# Patient Record
Sex: Female | Born: 1935 | Race: Black or African American | Hispanic: No | Smoking: Never smoker
Health system: Southern US, Community
[De-identification: ages and names within clinical notes are randomized; demographics above are authoritative.]

## PROBLEM LIST (undated history)

## (undated) DIAGNOSIS — I10 Essential (primary) hypertension: Secondary | ICD-10-CM

## (undated) DIAGNOSIS — E785 Hyperlipidemia, unspecified: Secondary | ICD-10-CM

## (undated) DIAGNOSIS — I509 Heart failure, unspecified: Secondary | ICD-10-CM

## (undated) DIAGNOSIS — I219 Acute myocardial infarction, unspecified: Secondary | ICD-10-CM

## (undated) DIAGNOSIS — I350 Nonrheumatic aortic (valve) stenosis: Secondary | ICD-10-CM

## (undated) DIAGNOSIS — K219 Gastro-esophageal reflux disease without esophagitis: Secondary | ICD-10-CM

## (undated) DIAGNOSIS — M1990 Unspecified osteoarthritis, unspecified site: Secondary | ICD-10-CM

## (undated) DIAGNOSIS — R600 Localized edema: Secondary | ICD-10-CM

## (undated) DIAGNOSIS — I499 Cardiac arrhythmia, unspecified: Secondary | ICD-10-CM

## (undated) DIAGNOSIS — H409 Unspecified glaucoma: Secondary | ICD-10-CM

## (undated) DIAGNOSIS — Z9289 Personal history of other medical treatment: Secondary | ICD-10-CM

## (undated) DIAGNOSIS — I2510 Atherosclerotic heart disease of native coronary artery without angina pectoris: Secondary | ICD-10-CM

## (undated) HISTORY — DX: Personal history of other medical treatment: Z92.89

## (undated) HISTORY — DX: Localized edema: R60.0

## (undated) HISTORY — DX: Atherosclerotic heart disease of native coronary artery without angina pectoris: I25.10

## (undated) HISTORY — PX: GLAUCOMA SURGERY: SHX656

## (undated) HISTORY — DX: Nonrheumatic aortic (valve) stenosis: I35.0

---

## 1995-06-05 DIAGNOSIS — H409 Unspecified glaucoma: Secondary | ICD-10-CM

## 1995-06-05 DIAGNOSIS — M1990 Unspecified osteoarthritis, unspecified site: Secondary | ICD-10-CM

## 1995-06-05 HISTORY — DX: Unspecified glaucoma: H40.9

## 1995-06-05 HISTORY — DX: Unspecified osteoarthritis, unspecified site: M19.90

## 1997-09-08 ENCOUNTER — Emergency Department (HOSPITAL_COMMUNITY): Admission: EM | Admit: 1997-09-08 | Discharge: 1997-09-08 | Payer: Self-pay

## 1998-05-22 HISTORY — PX: CARDIAC CATHETERIZATION: SHX172

## 1998-12-29 ENCOUNTER — Emergency Department (HOSPITAL_COMMUNITY): Admission: EM | Admit: 1998-12-29 | Discharge: 1998-12-29 | Payer: Self-pay | Admitting: Emergency Medicine

## 2000-11-25 ENCOUNTER — Encounter: Payer: Self-pay | Admitting: Cardiovascular Disease

## 2000-11-25 ENCOUNTER — Ambulatory Visit (HOSPITAL_COMMUNITY): Admission: RE | Admit: 2000-11-25 | Discharge: 2000-11-25 | Payer: Self-pay | Admitting: Cardiovascular Disease

## 2001-05-22 ENCOUNTER — Encounter: Payer: Self-pay | Admitting: Internal Medicine

## 2001-05-22 ENCOUNTER — Ambulatory Visit (HOSPITAL_COMMUNITY): Admission: RE | Admit: 2001-05-22 | Discharge: 2001-05-22 | Payer: Self-pay | Admitting: Internal Medicine

## 2001-08-20 ENCOUNTER — Encounter: Payer: Self-pay | Admitting: Emergency Medicine

## 2001-08-20 ENCOUNTER — Inpatient Hospital Stay (HOSPITAL_COMMUNITY): Admission: EM | Admit: 2001-08-20 | Discharge: 2001-08-21 | Payer: Self-pay | Admitting: Emergency Medicine

## 2002-12-01 ENCOUNTER — Encounter: Payer: Self-pay | Admitting: Emergency Medicine

## 2002-12-01 ENCOUNTER — Emergency Department (HOSPITAL_COMMUNITY): Admission: EM | Admit: 2002-12-01 | Discharge: 2002-12-01 | Payer: Self-pay | Admitting: Emergency Medicine

## 2003-01-18 ENCOUNTER — Encounter: Admission: RE | Admit: 2003-01-18 | Discharge: 2003-03-22 | Payer: Self-pay | Admitting: Cardiology

## 2005-03-12 ENCOUNTER — Inpatient Hospital Stay (HOSPITAL_COMMUNITY): Admission: EM | Admit: 2005-03-12 | Discharge: 2005-03-14 | Payer: Self-pay | Admitting: *Deleted

## 2005-03-14 ENCOUNTER — Encounter (INDEPENDENT_AMBULATORY_CARE_PROVIDER_SITE_OTHER): Payer: Self-pay | Admitting: Specialist

## 2006-06-04 HISTORY — PX: CATARACT EXTRACTION: SUR2

## 2007-01-06 ENCOUNTER — Inpatient Hospital Stay (HOSPITAL_COMMUNITY): Admission: EM | Admit: 2007-01-06 | Discharge: 2007-01-07 | Payer: Self-pay | Admitting: Emergency Medicine

## 2007-01-17 ENCOUNTER — Ambulatory Visit (HOSPITAL_COMMUNITY): Admission: RE | Admit: 2007-01-17 | Discharge: 2007-01-17 | Payer: Self-pay | Admitting: Ophthalmology

## 2007-04-16 ENCOUNTER — Ambulatory Visit (HOSPITAL_COMMUNITY): Admission: RE | Admit: 2007-04-16 | Discharge: 2007-04-16 | Payer: Self-pay | Admitting: Ophthalmology

## 2008-12-23 DIAGNOSIS — Z9289 Personal history of other medical treatment: Secondary | ICD-10-CM

## 2008-12-23 HISTORY — DX: Personal history of other medical treatment: Z92.89

## 2009-03-22 DIAGNOSIS — Z9289 Personal history of other medical treatment: Secondary | ICD-10-CM

## 2009-03-22 HISTORY — DX: Personal history of other medical treatment: Z92.89

## 2009-07-05 ENCOUNTER — Encounter (INDEPENDENT_AMBULATORY_CARE_PROVIDER_SITE_OTHER): Payer: Self-pay | Admitting: Cardiology

## 2009-07-05 ENCOUNTER — Ambulatory Visit: Payer: Self-pay | Admitting: Vascular Surgery

## 2009-07-05 ENCOUNTER — Ambulatory Visit (HOSPITAL_COMMUNITY): Admission: RE | Admit: 2009-07-05 | Discharge: 2009-07-05 | Payer: Self-pay | Admitting: Cardiology

## 2009-10-15 ENCOUNTER — Emergency Department (HOSPITAL_COMMUNITY): Admission: EM | Admit: 2009-10-15 | Discharge: 2009-10-15 | Payer: Self-pay | Admitting: Family Medicine

## 2010-05-22 ENCOUNTER — Encounter
Admission: RE | Admit: 2010-05-22 | Discharge: 2010-05-22 | Payer: Self-pay | Source: Home / Self Care | Attending: Cardiology | Admitting: Cardiology

## 2010-09-12 DIAGNOSIS — Z9289 Personal history of other medical treatment: Secondary | ICD-10-CM

## 2010-09-12 HISTORY — DX: Personal history of other medical treatment: Z92.89

## 2010-10-17 NOTE — Op Note (Signed)
Brittany [name], KREFT NO.:  [id number]   MEDICAL RECORD NO.:  [id number]          PATIENT TYPE:  INP   LOCATION:  3731                         FACILITY:  [facility]   PHYSICIAN:  Salley Scarlet., M.D.DATE OF BIRTH:  May 10, 1911   DATE OF PROCEDURE:  DATE OF DISCHARGE:  01/07/2007                               OPERATIVE REPORT   PREOPERATIVE DIAGNOSIS:  Immature cataract, right eye.   POSTOPERATIVE DIAGNOSIS:  Immature cataract, right eye.   OPERATION:  Extracapsular cataract extraction with intraocular lens  implantation.   ANESTHESIA:  General.   JUSTIFICATION FOR PROCEDURE:  This is a 89-year essentially one-eyed  individual who has been treated for years for glaucoma.  She has  complained of difficulty seeing to read and to get around.  We had  advised her in the past that cataract surgery is to be considered, and  she has decided that she is ready.  She finally requested that the  cataract be removed, and she is admitted at this time for that purpose.  Because she is a one-eyed individual, we decided to do her under general  anesthesia rather than doing her with a retrobulbar.   PROCEDURE:  Under the influence of general inhalation anesthesia, the  patient was prepped and draped in the usual manner.  The lid speculum  was inserted under the upper and lower lid of the right eye and a 4-0  silk traction suture was passed through the belly of the superior rectus  muscle retraction.  A fornix-based conjunctival flap was turned and  hemostasis achieved using cautery.  An groove incision was made around  the limbus at 120 degrees.  The anterior chamber was entered through  this groove incision at 11:30 o'clock position using a Superblade.  Ocucoat was injected into the eye.  An anterior capsulotomy was done.  The pupil was extremely miotic, making even a capsulotomy difficult to  do.  The corneoscleral wound was then extended first toward the left,  then  toward the right, placing a single 8-0 Vicryl suture across each  incision as it was made respectively toward the left and toward the  right.  The nucleus was then manually expressed from the eye with a  moderate amount of difficulty.  The two previously placed sutures were  closed, and a third one was placed at the 12:00 o'clock position.  The  IA handpiece was fastened to the eye, and the residual cortical material  was aspirated.  The posterior capsule was polished using olive-tip  polisher.  An Easy 60 PMMA lens was seated into the eye behind the iris  without difficulty.  The anterior chamber was reformed, and the pupil  was constricted using Miochol.  The corneoscleral wound was closed with  using a combination of interrupted sutures of 8-0 Vicryl and 10-0 nylon.  After it was ascertained that was airtight and watertight, the  conjunctiva was closed using 8-0 Vicryl suture.  One mL of Celestone and  a 0.5 mL of gentamicin were injected subconjunctivally.  Maxitrol  ophthalmic ointment and Pilopine ointment were applied along with a  patch and Fox shield.  The patient tolerated the procedure well and was  discharged to the post-anesthesia recovery room in satisfactory  condition.  She and the family are instructed for to rest today, to keep  the  eye patched, to take Darvocet every 4 hours as needed for pain, and she  is to see me in office tomorrow for further evaluation.   DISCHARGE DIAGNOSIS:  Immature cataract, right eye.      Salley Scarlet., M.D.  Electronically Signed     TB/MEDQ  D:  01/17/2007  T:  01/19/2007  Job:  [id number]

## 2010-10-20 NOTE — Cardiovascular Report (Signed)
[place]. [facility]  Patient:    Brittany [name], Brittany [name]                   MRN: [id number] Proc. Date: 11/25/00 Attending:  Lennette Bihari, M.D. CC:         Dondra Spry. Spruill, M.D.   Cardiac Catheterization  INDICATIONS:  The patient is a very pleasant 77 year old, black female, with a history of VF arrest in the setting of an MI for which she treated with primary PTCA of a bifurcating LAD and diagonal vessel 99% stenosis in December of 1998.  A followup stress test in July 1999 showed a small area of transmural scar with an EF of 52% without significant ischemia.  Additional problems include hypertension, hyperlipidemia.  The patient recently has noticed some mild exertional shortness of breath.  A Persantine Cardiolite study suggested the possibility of ischemia in the LAD territory.  She is now referred for definitive repeat catheterization.  DESCRIPTION OF PROCEDURE:  After premedication with Valium 3 mg intravenously, the patient was prepped and draped in the usual fashion.  The right femoral artery was punctured anteriorly and a 6 French sheath was inserted without difficulty.  Selective angiography was done utilizing a 6 [place] diagnostic left and right coronary catheters.  Of note, after the fifth left coronary injection, the patient seemed to develop VF.  This was immediately recognized. She was defibrillated x1 with restoration of sinus rhythm.  The patient was then treated with IV Lopressor 2.5 mg IV.  Two additional injections were made in the left coronary system without problem.  The right coronary artery was then injected without difficulty.  Biplane left ventriculography was done as was distal aortography utilizing the pigtail catheter.  The arterial sheath was removed with manual compressor.  The patient tolerated the procedure well and will be transported to telemetry for at least 6-8 hours to make sure her rhythm remains  stable.  HEMODYNAMIC DATA:  Central aortic pressure 120/51.  Left ventricular pressure 120/15.  ANGIOGRAPHIC DATA:  Left main coronary artery:  The left main coronary artery had minimal 10% ostial narrowing bifurcating into an LAD and left circumflex system.  Left anterior descending:  The LAD had smooth 60% narrowing before and after the first diagonal vessel with 60% stenosis in this diagonal.  There was 50% mid LAD stenosis.  Circumflex artery:  The circumflex vessel had 20% proximal mid narrowing as well as 30% distal narrowing and all sites were smooth.  Right coronary artery:  The right coronary artery had a shepherds crook takeoff.  There is a 30% proximal narrowing and 20% mid narrowing.  Biplane cinearteriography revealed preserved global LV function.  Ejection fraction was 55%.  There was mild residual mid anterolateral hypocontractility from the patients prior myocardial infarction as seen on the RAO projection. On the LAO projection, contraction appeared normal.  DISTAL AORTOGRAM:  Distal aortography showed irregularity of the infrarenal aorta.  There was 40% right renal artery stenosis.  IMPRESSION: 1. Normal global left ventricular function with residual mild mid    anterolateral hypocontractility. 2. Residual coronary obstructive disease with 50-60% smooth narrowing    in the left anterior descending with 60% diagonal stenosis and 50%    mid left anterior descending stenosis, 20% proximal to 30% mid    left circumflex stenosis, and 20-30% proximal to mid right coronary artery    stenoses. 3. Transient ventricular fibrillation treated with defibrillation. 4. Irregularity of the infrarenal aorta with  40% right renal artery    stenosis.  RECOMMENDATIONS:  The patient is 77 years old.  Her coronary anatomy does show residual narrowing of 50-60% in the LAD in the region of the diagonal takeoff with 60% diagonal stenosis.  Increased medical therapy be recommended and  in specific the patients beta blocker dose will be increased.  She will be transferred to telemetry for monitoring of her heart rhythm following catheterization with ultimate plans for discharge on increased medical therapy. DD:  11/25/00 TD:  11/25/00 Job: [id number]

## 2010-10-20 NOTE — Op Note (Signed)
Brittany [name], CATALFAMO NO.:  [id number]   MEDICAL RECORD NO.:  [id number]          PATIENT TYPE:  INP   LOCATION:  2016                         FACILITY:  [facility]   PHYSICIAN:  Petra Kuba, M.D.    DATE OF BIRTH:  1911-04-25   DATE OF PROCEDURE:  03/14/2005  DATE OF DISCHARGE:                                 OPERATIVE REPORT   PROCEDURE:  EGD with biopsy.   INDICATIONS:  Patient with chronic anemia, some nausea and vomiting on a  moderate amount of aspirin and nonsteroidals.   CONSENT:  Consent was signed after the risks, benefits, methods, and options  were thoroughly discussed yesterday in the hospital.   MEDICINES USED:  Demerol 25, Versed 2.5.   PROCEDURE IN DETAIL:  The video endoscope was inserted by direct vision. The  proximal esophagus was normal. In the mid to distal esophagus was a widely  patent ring with some linear inflammation. There was a small to medium-sized  hiatal hernia. The scope passed easily into the stomach and advanced to the  antrum where some minimal antritis was seen and a few tiny erosions. The  scope was passed into the pylorus and to the duodenal bulb where some mild  bulbitis was seen and a small duodenal bulb ulcer. The scope was passed into  the second portion of the duodenum, which was normal. The scope was  withdrawn back to the bulb which was reevaluated, and no additional findings  were seen. The ulcer did not have any worrisome stigmata. The scope was  withdrawn back to the stomach and retroflexed. Angularis and fundus were  normal. Lesser and greater curve were normal. High in the cardia, the hiatal  hernia was confirmed. Straight visualization of the stomach confirmed some  minimal gastritis. No other abnormalities. A few biopsies of the antrum and  a few of the proximal stomach were obtained to rule out Helicobacter. Air  was suctioned. The scope was slowly withdrawn again. A good look at the  esophagus confirmed the  above findings. The scope was removed. The patient  tolerated the procedure well. There was no obvious immediate complication.   ENDOSCOPIC DIAGNOSIS:  1.  Small to medium-sized hiatal hernia with widely patent ring and linear      information.  2.  Minimal gastritis and a few antral erosions.  3.  Minimal bulbitis and a tiny ulcer in the bulb.  4.  Otherwise, within normal limits EGD without signs of active bleeding.   PLAN:  Pump inhibitors long-term, not only because of her aspirin and  nonsteroidals but also for her hiatal hernia and above-mentioned findings.  Follow-up with Dr. Matthias Hughs as an outpatient in 1-2 months to see if a one-  time  colonoscopy is indicated.  May want to repeat CBCs and guaiacs to help him  decide. Happy to see back sooner p.r.n. Will go ahead and write for her  Protonix once a day. Could in the future, if it is cheaper, use Prilosec  over-the-counter once a day. Will call her with the biopsy results in 1  week.  ______________________________  Petra Kuba, M.D.     MEM/MEDQ  D:  03/14/2005  T:  03/14/2005  Job:  [id number]   cc:   Nicki Guadalajara, M.D.  Fax: [phone]   Bernette Redbird, M.D.  Fax: [phone]   Osvaldo Shipper. Spruill, M.D.  Fax: [phone]

## 2010-10-20 NOTE — Discharge Summary (Signed)
[place]. [facility]  Patient:    Brittany [name], Brittany [name] Visit Number: [id number] MRN: [id number]          Service Type: MED Location: [phone] Attending Physician:  Darlin Priestly Dictated by:   Marya Fossa, P.A. Admit Date:  08/20/2001 Discharge Date: 08/21/2001                             Discharge Summary  DATE OF BIRTH:  June 06, 1910  [facility] Medical Record Number [id number]  ADMISSION DIAGNOSES: 1. Chest pain, rule out myocardial infarction. 2. Accelerated blood pressure. 3. Coronary artery disease. 4. Hyperlipidemia. 5. Glaucoma. 6. Arthritis. 7. History of chronic lower extremity edema.  DISCHARGE DIAGNOSES: 1. Chest pain resolved, myocardial infarction ruled out with negative    enzymes, medical therapy. (Cardiac catheterization not performed as    enzymes and electrocardiogram were negative.  Also, the patient has has    ventricular fibrillation arrest with engagement of left coronary system    x 2 in the past.) 2. Accelerated blood pressure. 3. Coronary artery disease. 4. Hyperlipidemia. 5. Glaucoma. 6. Arthritis. 7. History of chronic lower extremity edema.  HISTORY OF PRESENT ILLNESS:  Brittany [name] is a very pleasant 77 year old African-American female patient of Dr. Nicki Guadalajara with known coronary artery disease, hypertension, hyperlipidemia, arthritis, glaucoma, and lower extremity edema.  She presented to the emergency room on the day of admission with complaints of chest pain.  Apparently when she was going to sleep, she developed left-sided chest pressure and pain without any associated symptoms. She took a nitroglycerin and aspirin with relief of her symptoms.  She then awoke again at 3 in the morning with similar discomfort but no associated symptoms.  She called EMS and was delivered to the emergency room.  She was given aspirin and nitroglycerin again with some relief, but the pain  returned. She states the pain lasted for only a few seconds.  EKG in the emergency room shows normal sinus rhythm with nonspecific ST-T wave changes.  Chest x-ray was unremarkable.  Initial cardiac enzymes negative.  The patient will be admitted for IV heparin and IV nitroglycerin.  Check serial cardiac enzymes.  The plan is that, if she rules out for MI, will consider discharge to home with possible outpatient Cardiolite.  I was to repeat catheterization due to stable coronary artery disease at last catheterization and history of ventricular fibrillation arrest with catheterization.  PROCEDURES:  None.  COMPLICATIONS:  None.  CONSULTATIONS:  None.  COURSE IN HOSPITAL:  Brittany [name] was admitted to [facility] on August 20, 2001, as described above.  IV heparin and IV nitroglycerin; home medicines were continued.  Admission labs showed a WBC 4.9, hemoglobin 11.6, platelets 206.  Sodium 142, potassium 3.7, BUN 14, creatinine 0.8.  Cardiac enzymes were negative x 3.  EKG remained nonacute.  The patient did well overnight and had no further chest pain.  IV heparin and nitroglycerin were discontinued, and she ambulated.  She tolerated this well. She was felt ready for discharge to home by Dr. Elsie Lincoln.  Will plan an outpatient Cardiolite.  Will also give her a prescription to heme check stools as she does have some mild anemia.  If these are positive, she may need a GI workup.  DISCHARGE MEDICATIONS:  1. Isosorbide 60 mg 1-1/2 pills a day.  2. Enteric-coated aspirin 81 mg q.d.  3. Lipitor 80  mg a day.  4. Atenolol 50 mg a day.  5. Lotrel 5/10 mg a day.  6. Lasix 40 mg a day in the morning and 20 mg in the evening.  7. Potassium 20 mEq a day.  8. Alphagan and pilocarpine eye drops as directed.  9. ______ 150 b.i.d. 10. Nitroglycerin as needed for chest pain.  ACTIVITY:  As tolerated.  DIET:  Low fat, low cholesterol, low salt.  SPECIAL INSTRUCTIONS: May call if  any problems or questions.  FOLLOWUP:  Dr. Tresa Endo before 12 oclock.  The office will call with an appointment for stress testing. Dictated by:   Marya Fossa, P.A. Attending Physician:  Darlin Priestly DD:  08/21/01 TD:  08/22/01 Job: [id number]

## 2010-10-20 NOTE — Consult Note (Signed)
NAMEABENA, ERDMAN NO.:  [id number]   MEDICAL RECORD NO.:  [id number]          PATIENT TYPE:  INP   LOCATION:  2016                         FACILITY:  [facility]   PHYSICIAN:  Petra Kuba, M.D.    DATE OF BIRTH:  November 19, 1910   DATE OF CONSULTATION:  03/13/2005  DATE OF DISCHARGE:                                   CONSULTATION   HISTORY OF PRESENT ILLNESS:  The patient seen at the request of Dr. Tresa Endo  for nausea, vomiting, and anemia.  Supposedly, she has a long history of  chronic anemia.  Did have some nausea and vomiting which has since resolved.  Is eating regular food.  Not had a bowel movement today but has not seen any  blood in her bowels.  She had an endoscopy at one point in the past in 1999  per our office chart and may have had a colonoscopy even before that but  does not remember her last evaluation.  She is not having any other GI  symptoms.  Does take both aspirin, nonsteroidal's and Cox inhibitors.   PAST MEDICAL HISTORY:  Pertinent for some heart problems.  She also has  increased cholesterol.  Chronic leg edema.  Glaucoma.  Arthritis.   FAMILY HISTORY:  Negative for any obvious GI problems.   SOCIAL HISTORY:  Quit tobacco in the 82s.  Does not drink.  Does use some  over-the-counter arthritis medicines like ibuprofen.   ALLERGIES:  None.   CURRENT MEDICATIONS:  Potassium.  Imdur.  Aspirin.  Crestor.  Darvocet.  Atenolol.  Norvasc.  Lasix.  Cozaar.  Actonel.  Fish oil.  Ibuprofen.  Eye  drops.   REVIEW OF SYSTEMS:  Pertinent for being improved.   PHYSICAL EXAMINATION:  GENERAL:  In no acute distress.  ABDOMEN:  Soft, nontender.   LABORATORY DATA:  Pertinent for a hemoglobin of 8.7 which dropped to 8.0  without signs of obvious active bleeding.  Isoenzymes are negative.  She  does have a little bit of blood in her urine.  Lipase normal on admission.  Liver tests normal on admission.  Her calcium was a little elevated.  BUN  and creatinine  19 and 0.9.  MCV 83.  White count and platelet  count normal.   ASSESSMENT:  1.  Multiple medical problems.  2.  Possibly acute and chronic anemia.  She does have low iron but also low      TIBC and ____, her ferritin is about 100, double check those numbers in      the computer.  3.  Resolved nausea and vomiting.  4.  Very minimally elevated calcium could be playing a role.  5.  Some microscopic hematuria.   PLAN:  Workup for her calcium and her hematuria per either Dr. Tresa Endo or Dr.  Shana Chute.  I have discussed transfusion with the patient including the risk  of AIDS and hepatitis and she agrees with proceeding.  Also I have discussed  an  endoscopy, the risks, benefits and methods of that and we will proceed  tomorrow morning. As long as she is stable afterwards and  no other medical  problems I will give her my standpoint to go home and then pending those  findings can followup with Dr. Matthias Hughs to discuss a one-time colon check.  We will follow with you.  Thank you very much.           ______________________________  Petra Kuba, M.D.     MEM/MEDQ  D:  03/13/2005  T:  03/13/2005  Job:  [id number]   cc:   Osvaldo Shipper. Spruill, M.D.  Fax: [phone]   Nicki Guadalajara, M.D.  Fax: [phone]   Bernette Redbird, M.D.  Fax: [phone]

## 2010-10-20 NOTE — Discharge Summary (Signed)
NAMECEDAR, Brittany [name] NO.:  [id number]   MEDICAL RECORD NO.:  [id number]          PATIENT TYPE:  INP   LOCATION:  2016                         FACILITY:  [facility]   PHYSICIAN:  Nicki Guadalajara, M.D.     DATE OF BIRTH:  February 04, 1911   DATE OF ADMISSION:  03/12/2005  DATE OF DISCHARGE:  03/14/2005                                 DISCHARGE SUMMARY   CHIEF COMPLAINT:  1.  Atypical chest pain.  2.  Nausea and vomiting.  3.  Hyperlipidemia.  4.  Coronary artery disease with a history of myocardial infarction and      ventricular fibrillation arrest in 1998.  5.  Hematuria.   DISCHARGE DIAGNOSES:  1.  Anemia.  2.  Gastritis, bulbitis, small to medium hiatal hernia by      esophagogastroduodenoscopy with no active bleeding, Dr. Ewing Schlein.  3.  Coronary artery disease with history of myocardial infarction      ventricular fibrillation arrest.      1.  Percutaneous transluminal coronary angioplasty of the left anterior          descending artery and a diagonal 1998.      2.  Last catheterization, 2003, with 50-60% left anterior descending          artery, 50% diagonal, 20-30% right coronary artery, and 20-30% left          circumflex.  4.  Peripheral vascular occlusive disease.  5.  Hypertension.  6.  Hyperlipidemia.  7.  Chronic lower extremity edema.  8.  Osteoarthritis.  9.  Glaucoma.   PROCEDURES:  1.  EGD by Dr. Ewing Schlein.  2.  Transfusion.   BRIEF HISTORY:  The patient is a 77 year old black female with a history of  MI presented to the ER at [facility] with complaints of nausea and  vomiting x3 on the morning of admission.  Onset of symptoms were abrupt.  At  9 a.m. she called Dr. Magda Kiel office and was advised to go to the ER for  evaluation.  Her presentation in 1998 was atypical.  The patient did not  have any chest pain or shortness of breath but complained of nausea,  vomiting, and diarrhea.   PAST MEDICAL HISTORY:  1.  Anterior wall MI with Vfib  arrest in 1998, treated with PTCA and kissing      balloons at the bifurcation of the LAD and diagonal.      1.  Recent cath, 2003, as noted above.  2.  Peripheral vascular occlusive disease with infrarenal abdominal aortic      aneurysm, 40% right renal artery stenosis.  3.  Hypertension.  4.  Hyperlipidemia.  5.  Chronic lower extremity edema.  6.  Glaucoma.  7.  Arthritis.   FAMILY HISTORY:  Noncontributory.   SOCIAL HISTORY:  She is widowed, lives with her daughter, has four  grandchildren and five great-great grandchildren.  She quit tobacco in the  1980s.   ALLERGIES:  None.   MEDICATIONS:  1.  Potassium 20 mEq daily.  2.  Imdur 60 mg daily.  3.  Aspirin 81 mg daily.  4.  Crestor 40 mg daily.  5.  Darvocet 1-2 p.o. q.4h. p.r.n.  6.  Atenolol 25 mg daily.  7.  Norvasc 5 mg daily.  8.  Lasix 20 mg daily.  9.  Cozaar 100 mg b.i.d.  10. Actonel 5 mg daily.  11. Fish oil 1 gram b.i.d.  12. Ibuprofen one p.o. every day p.r.n.  13. Travatan ophthalmic drops, one drop O.U. h.s.   REVIEW OF SYSTEMS:  Nausea, vomiting, difficulty with urination.  No chest  pain.  Positive arthritic pain of her right knee.   For further history and physical please see the dictated note.   HOSPITAL COURSE:  The patient was seen and admitted by Dr. Elsie Lincoln.  He  wanted to rule out an MI.  The following a.m. troponins were negative as  were the point-of-care markers.  Her UA showed 11-20 RBCs.  Her hemoglobin  had gone from 8.9 down to 8, after less than 12 hours of admission.  She was  seen by Dr. Daphene Jaeger.  From a cardiac standpoint she was stable.  She  actually wanted to go home but we obtained a GI consult with Dr. Donavan Burnet  office who had seen her previously.  Dr. Ewing Schlein was on call.  He scheduled  the patient for an EGD which was done today on March 14, 2005.  The EGD  showed some mild gastritis and duodenitis with small to moderate hiatal  hernia with no active bleeding.  He  recommended proton pump inhibitors to  start her on Protonix.  It was his opinion the patient could go home, be  followed up by Dr. Ewing Schlein.  The patient also received two units of packed  cells.  The patient is going to be mobilized.  If she continues to do well,  we are going to discharge her home later today.   DISCHARGE MEDICATIONS:  1.  Crestor 40 mg daily.  2.  Zetia 10 mg daily.  3.  Celebrex 200 mg daily.  4.  Imdur 60 mg daily.  5.  Lasix 20 mg daily.  6.  KCl 20 mEq daily.  7.  Aspirin 81 mg daily.  8.  Multivitamin one daily.  9.  Fish oil one daily.  10. Atenolol 25 mg daily.  11. Travatan ophthalmic drops one drop O.U. daily.  12. Protonix 40 mg daily.  13. Norvasc 5 mg one daily.  14. Darvocet-N 100, 1-2 p.o. q.4h. p.r.n.  15. Fentanyl 50 mg patch every 72 hours for arthritic pain.  16. Actonel 5 mg daily.   1.  The patient will return to see Dr. Jacinto Halim in two weeks.  2.  She is to contact Dr. Matthias Hughs for followup in his office in two weeks      also.   DISCHARGE ACTIVITY:  Light to moderate.  The patient is 73 and not employed.   We will see her back in two weeks in our office and follow up with Dr.  Matthias Hughs in two weeks.      Eber Hong, P.A.    ______________________________  Nicki Guadalajara, M.D.    WDJ/MEDQ  D:  03/14/2005  T:  03/14/2005  Job:  [id number]   cc:   Nicki Guadalajara, M.D.  Fax: [phone]   Bernette Redbird, M.D.  Fax: [phone]   Petra Kuba, M.D.  Fax: [phone]   Osvaldo Shipper. Spruill, M.D.  Fax: [phone]

## 2010-10-20 NOTE — Discharge Summary (Signed)
[name], [name] NO.:  [id number]   MEDICAL RECORD NO.:  [id number]          PATIENT TYPE:  INP   LOCATION:  3731                         FACILITY:  [facility]   PHYSICIAN:  Nicki Guadalajara, M.D.     DATE OF BIRTH:  [birth year]   DATE OF ADMISSION:  01/06/2007  DATE OF DISCHARGE:  01/07/2007                               DISCHARGE SUMMARY   DISCHARGE DIAGNOSES:  1. Chest pain.  2. History of coronary artery disease status post myocardial      infarction and percutaneous coronary intervention of the left      anterior descending artery in 1998.  3. Peripheral vascular disease.  4. Dyslipidemia.  5. Hypertension.  6. Chronic lower extremity edema.   BRIEF HISTORY:  [name] is a 77 year old, African-American female  who presented to the emergency department at [facility] with  complaints of upper back and neck pain as well as jaw pain.  This has  been present for several days.  She did obtain minimal relief with  sublingual nitroglycerin. Because of her history of coronary disease,  she presented to the emergency department for further evaluation and  treatment. [name] was admitted to telemetry with a diagnosis of  atypical chest pain.  She ruled out for myocardial infarction with  admission CK of 19, CK-MB of 0.6 and troponin of 0.02. There was no  elevation in her cardiac enzymes, her TSH was 1.013, hemoglobin was 9.9,  hematocrit 30. At the time of discharge, her sodium was 138, potassium  4.0, BUN 14 and creatinine 0.76. There was a slight drop in her  hemoglobin to 8.7 although she has a history of GERD. Dr. Allyson Sabal  recommended proceeding with discharge home and follow-up CBC as an  outpatient. We also recommended that she begin Prilosec OTC 20 mg twice  daily. She had no recurrent chest or back pain and was agreeable to  discharge home.  We will schedule her in the office for a Persantine  Myoview stress test in order to rule out any  evidence of ischemia.   DISCHARGE INSTRUCTIONS:  She is to follow a low sodium heart healthy  diet. She is instructed to contact our office with any increased chest  pain or new symptoms. She is scheduled for a follow-up appointment. She  is scheduled for a Persantine Myoview stress test August 28 at 8:15 and  to be seen in the office by Dr. Tresa Endo at some point after that test is  complete.   DISCHARGE MEDICATIONS:  1. Potassium 20 mEq daily.  2. Imdur 60 mg daily.  3. Crestor 10 mg daily.  4. Atenolol 12.5 mg daily.  5. Aspirin 81 mg daily.  6. Lasix 20 mg daily.  7. Actonel 5 mg daily.  8. Cozaar 100 mg daily.  9. Fish oil 1000 mg daily.  10.Multivitamin daily.  11.Norvasc 10 mg.  12.Fentanyl 50 mg patch every 4 days.  13.Iron daily.  14.Travatan 1 drop daily.  15.Cosopt 1 drop daily.  16.Prilosec OTC 20 mg b.i.d.     ______________________________  Charmian Muff, NP    ______________________________  Nicki Guadalajara, M.D.    LS/MEDQ  D:  02/11/2007  T:  02/12/2007  Job:  [id number]   cc:   [facility]

## 2011-03-19 LAB — URINALYSIS, ROUTINE W REFLEX MICROSCOPIC
Hgb urine dipstick: NEGATIVE
Ketones, ur: NEGATIVE
Protein, ur: NEGATIVE
Urobilinogen, UA: 0.2

## 2011-03-19 LAB — CBC
HCT: 26.4 — ABNORMAL LOW
Hemoglobin: 9.1 — ABNORMAL LOW
MCHC: 33
MCHC: 33.1
MCV: 87.4
Platelets: 485 — ABNORMAL HIGH
RDW: 14.5 — ABNORMAL HIGH
RDW: 14.6 — ABNORMAL HIGH
RDW: 14.6 — ABNORMAL HIGH
WBC: 7.7

## 2011-03-19 LAB — CARDIAC PANEL(CRET KIN+CKTOT+MB+TROPI)
CK, MB: 0.6
CK, MB: 0.7
Relative Index: INVALID
Total CK: 15
Total CK: 19
Troponin I: 0.02

## 2011-03-19 LAB — BASIC METABOLIC PANEL
BUN: 14
CO2: 25
Calcium: 10.1
Chloride: 108
Creatinine, Ser: 0.78
GFR calc Af Amer: >60
GFR calc non Af Amer: >60
Glucose, Bld: 101 — ABNORMAL HIGH
Potassium: 4
Sodium: 138
Sodium: 139

## 2011-03-19 LAB — COMPREHENSIVE METABOLIC PANEL
Alkaline Phosphatase: 74
BUN: 17
CO2: 23
Calcium: 10
GFR calc non Af Amer: >60
Glucose, Bld: 95
Total Protein: 7.6

## 2011-03-19 LAB — MAGNESIUM: Magnesium: 2.2

## 2011-03-19 LAB — DIFFERENTIAL
Basophils Relative: 0
Eosinophils Absolute: 0.1
Lymphs Abs: 1.7
Monocytes Relative: 6
Neutro Abs: 6.5
Neutrophils Relative %: 73

## 2011-03-19 LAB — TSH: TSH: 1.013

## 2011-03-19 LAB — CK TOTAL AND CKMB (NOT AT ARMC)
Relative Index: INVALID
Total CK: 53

## 2011-03-19 LAB — PROTIME-INR
INR: 1.2
Prothrombin Time: 15.8 — ABNORMAL HIGH

## 2011-03-19 LAB — URINE MICROSCOPIC-ADD ON

## 2011-07-14 ENCOUNTER — Encounter (HOSPITAL_COMMUNITY): Payer: Self-pay | Admitting: *Deleted

## 2011-07-14 ENCOUNTER — Other Ambulatory Visit: Payer: Self-pay

## 2011-07-14 ENCOUNTER — Inpatient Hospital Stay (HOSPITAL_COMMUNITY)
Admission: EM | Admit: 2011-07-14 | Discharge: 2011-07-17 | DRG: 641 | Disposition: A | Discharge status: Skilled Nursing Facility | Payer: Medicare Other | Attending: Internal Medicine | Admitting: Internal Medicine

## 2011-07-14 ENCOUNTER — Emergency Department (HOSPITAL_COMMUNITY): Payer: Medicare Other

## 2011-07-14 DIAGNOSIS — R627 Adult failure to thrive: Secondary | ICD-10-CM | POA: Diagnosis present

## 2011-07-14 DIAGNOSIS — Z66 Do not resuscitate: Secondary | ICD-10-CM | POA: Diagnosis not present

## 2011-07-14 DIAGNOSIS — E785 Hyperlipidemia, unspecified: Secondary | ICD-10-CM | POA: Diagnosis present

## 2011-07-14 DIAGNOSIS — I498 Other specified cardiac arrhythmias: Secondary | ICD-10-CM | POA: Diagnosis not present

## 2011-07-14 DIAGNOSIS — M1990 Unspecified osteoarthritis, unspecified site: Secondary | ICD-10-CM | POA: Diagnosis present

## 2011-07-14 DIAGNOSIS — I509 Heart failure, unspecified: Secondary | ICD-10-CM | POA: Diagnosis present

## 2011-07-14 DIAGNOSIS — Z79899 Other long term (current) drug therapy: Secondary | ICD-10-CM

## 2011-07-14 DIAGNOSIS — I10 Essential (primary) hypertension: Secondary | ICD-10-CM | POA: Diagnosis present

## 2011-07-14 DIAGNOSIS — K219 Gastro-esophageal reflux disease without esophagitis: Secondary | ICD-10-CM | POA: Diagnosis present

## 2011-07-14 DIAGNOSIS — Z7982 Long term (current) use of aspirin: Secondary | ICD-10-CM

## 2011-07-14 DIAGNOSIS — R4182 Altered mental status, unspecified: Secondary | ICD-10-CM

## 2011-07-14 DIAGNOSIS — I2510 Atherosclerotic heart disease of native coronary artery without angina pectoris: Secondary | ICD-10-CM | POA: Diagnosis present

## 2011-07-14 DIAGNOSIS — D649 Anemia, unspecified: Secondary | ICD-10-CM | POA: Diagnosis present

## 2011-07-14 DIAGNOSIS — R1310 Dysphagia, unspecified: Secondary | ICD-10-CM | POA: Diagnosis present

## 2011-07-14 DIAGNOSIS — H409 Unspecified glaucoma: Secondary | ICD-10-CM | POA: Diagnosis present

## 2011-07-14 DIAGNOSIS — R5381 Other malaise: Secondary | ICD-10-CM | POA: Diagnosis present

## 2011-07-14 DIAGNOSIS — I252 Old myocardial infarction: Secondary | ICD-10-CM

## 2011-07-14 DIAGNOSIS — Z993 Dependence on wheelchair: Secondary | ICD-10-CM

## 2011-07-14 HISTORY — DX: Hyperlipidemia, unspecified: E78.5

## 2011-07-14 HISTORY — DX: Gastro-esophageal reflux disease without esophagitis: K21.9

## 2011-07-14 HISTORY — DX: Essential (primary) hypertension: I10

## 2011-07-14 HISTORY — DX: Acute myocardial infarction, unspecified: I21.9

## 2011-07-14 HISTORY — DX: Cardiac arrhythmia, unspecified: I49.9

## 2011-07-14 HISTORY — DX: Heart failure, unspecified: I50.9

## 2011-07-14 LAB — DIFFERENTIAL
Basophils Relative: 0 % (ref 0–1)
Monocytes Relative: 6 % (ref 3–12)
Neutro Abs: 4.6 10*3/uL (ref 1.7–7.7)
Neutrophils Relative %: 71 % (ref 43–77)

## 2011-07-14 LAB — APTT: aPTT: 32 seconds (ref 24–37)

## 2011-07-14 LAB — URINALYSIS, ROUTINE W REFLEX MICROSCOPIC
Bilirubin Urine: NEGATIVE
Leukocytes, UA: NEGATIVE
Nitrite: NEGATIVE
Specific Gravity, Urine: 1.022 (ref 1.005–1.030)
pH: 6 (ref 5.0–8.0)

## 2011-07-14 LAB — TROPONIN I: Troponin I: <0.3 ng/mL (ref <0.30)

## 2011-07-14 LAB — BASIC METABOLIC PANEL
BUN: 18 mg/dL (ref 6–23)
Chloride: 107 mEq/L (ref 96–112)
GFR calc Af Amer: 67 mL/min — ABNORMAL LOW (ref >90)
GFR calc non Af Amer: 58 mL/min — ABNORMAL LOW (ref >90)
Potassium: 3.9 mEq/L (ref 3.5–5.1)
Sodium: 141 mEq/L (ref 135–145)

## 2011-07-14 LAB — HEPATIC FUNCTION PANEL
ALT: 8 U/L (ref 0–35)
Total Protein: 7.9 g/dL (ref 6.0–8.3)

## 2011-07-14 LAB — CBC
HCT: 27.9 % — ABNORMAL LOW (ref 36.0–46.0)
Hemoglobin: 10.1 g/dL — ABNORMAL LOW (ref 12.0–15.0)
Hemoglobin: 8.8 g/dL — ABNORMAL LOW (ref 12.0–15.0)
MCHC: 31.2 g/dL (ref 30.0–36.0)
MCHC: 31.5 g/dL (ref 30.0–36.0)
RBC: 3.49 MIL/uL — ABNORMAL LOW (ref 3.87–5.11)
RBC: 3.05 MIL/uL — ABNORMAL LOW (ref 3.87–5.11)
WBC: 6.6 10*3/uL (ref 4.0–10.5)

## 2011-07-14 LAB — URINE MICROSCOPIC-ADD ON

## 2011-07-14 LAB — CREATININE, SERUM
Creatinine, Ser: 0.8 mg/dL (ref 0.50–1.10)
GFR calc Af Amer: 69 mL/min — ABNORMAL LOW (ref >90)
GFR calc non Af Amer: 59 mL/min — ABNORMAL LOW (ref >90)

## 2011-07-14 LAB — PROTIME-INR
INR: 1.14 (ref 0.00–1.49)
Prothrombin Time: 14.8 seconds (ref 11.6–15.2)

## 2011-07-14 LAB — LACTIC ACID, PLASMA: Lactic Acid, Venous: 0.8 mmol/L (ref 0.5–2.2)

## 2011-07-14 MED ORDER — LOSARTAN POTASSIUM 50 MG PO TABS: 100.0000 mg | ORAL_TABLET | Freq: Every day | ORAL | Status: DC

## 2011-07-14 MED ORDER — ROSUVASTATIN CALCIUM 10 MG PO TABS
10.0000 mg | ORAL_TABLET | Freq: Every day | ORAL | Status: DC
  Administered 2011-07-15 – 2011-07-17 (×3): 10 mg via ORAL
  Filled 2011-07-14 (×3): qty 1

## 2011-07-14 MED ORDER — AMLODIPINE BESYLATE 5 MG PO TABS
5.0000 mg | ORAL_TABLET | Freq: Every day | ORAL | Status: DC
  Filled 2011-07-14: qty 1

## 2011-07-14 MED ORDER — ACETAMINOPHEN 650 MG RE SUPP: 650.0000 mg | Freq: Four times a day (QID) | RECTAL | Status: DC | PRN

## 2011-07-14 MED ORDER — FUROSEMIDE 40 MG PO TABS
40.0000 mg | ORAL_TABLET | Freq: Every day | ORAL | Status: DC
  Filled 2011-07-14: qty 1

## 2011-07-14 MED ORDER — ADULT MULTIVITAMIN W/MINERALS CH
1.0000 | ORAL_TABLET | Freq: Every day | ORAL | Status: DC
  Administered 2011-07-14 – 2011-07-17 (×4): 1 via ORAL
  Filled 2011-07-14 (×4): qty 1

## 2011-07-14 MED ORDER — AMLODIPINE BESYLATE 5 MG PO TABS
5.0000 mg | ORAL_TABLET | Freq: Once | ORAL | Status: AC
  Administered 2011-07-14: 5 mg via ORAL
  Filled 2011-07-14: qty 1

## 2011-07-14 MED ORDER — ACETAMINOPHEN 325 MG PO TABS: 650.0000 mg | ORAL_TABLET | Freq: Four times a day (QID) | ORAL | Status: DC | PRN

## 2011-07-14 MED ORDER — ASPIRIN EC 81 MG PO TBEC
81.0000 mg | DELAYED_RELEASE_TABLET | Freq: Every day | ORAL | Status: DC
  Administered 2011-07-14 – 2011-07-17 (×4): 81 mg via ORAL
  Filled 2011-07-14 (×4): qty 1

## 2011-07-14 MED ORDER — FENTANYL 50 MCG/HR TD PT72
50.0000 ug | MEDICATED_PATCH | TRANSDERMAL | Status: DC
  Administered 2011-07-14: 50 ug via TRANSDERMAL
  Filled 2011-07-14: qty 1

## 2011-07-14 MED ORDER — TRAVOPROST 0.004 % OP SOLN: 1.0000 [drp] | Freq: Every day | OPHTHALMIC | Status: DC

## 2011-07-14 MED ORDER — ISOSORBIDE MONONITRATE ER 60 MG PO TB24
60.0000 mg | ORAL_TABLET | Freq: Every day | ORAL | Status: DC
  Administered 2011-07-14 – 2011-07-17 (×4): 60 mg via ORAL
  Filled 2011-07-14 (×4): qty 1

## 2011-07-14 MED ORDER — FUROSEMIDE 20 MG PO TABS
40.0000 mg | ORAL_TABLET | Freq: Once | ORAL | Status: AC
  Administered 2011-07-14: 40 mg via ORAL
  Filled 2011-07-14: qty 2

## 2011-07-14 MED ORDER — LOSARTAN POTASSIUM 50 MG PO TABS
100.0000 mg | ORAL_TABLET | Freq: Every day | ORAL | Status: DC
  Administered 2011-07-14 – 2011-07-17 (×4): 100 mg via ORAL
  Filled 2011-07-14 (×4): qty 2

## 2011-07-14 MED ORDER — ATENOLOL 12.5 MG HALF TABLET
12.5000 mg | ORAL_TABLET | ORAL | Status: AC
  Administered 2011-07-14: 12.5 mg via ORAL
  Filled 2011-07-14: qty 1

## 2011-07-14 MED ORDER — POTASSIUM CHLORIDE 20 MEQ/15ML (10%) PO LIQD
20.0000 meq | Freq: Every day | ORAL | Status: DC
  Administered 2011-07-14 – 2011-07-17 (×4): 20 meq via ORAL
  Filled 2011-07-14 (×5): qty 15

## 2011-07-14 MED ORDER — AZELASTINE HCL 0.05 % OP SOLN: 1.0000 [drp] | Freq: Two times a day (BID) | OPHTHALMIC | Status: DC

## 2011-07-14 MED ORDER — AZELASTINE HCL 0.05 % OP SOLN
1.0000 [drp] | Freq: Two times a day (BID) | OPHTHALMIC | Status: DC
  Filled 2011-07-14: qty 1

## 2011-07-14 MED ORDER — OLOPATADINE HCL 0.1 % OP SOLN
1.0000 [drp] | Freq: Two times a day (BID) | OPHTHALMIC | Status: DC
  Administered 2011-07-14 – 2011-07-17 (×6): 1 [drp] via OPHTHALMIC
  Filled 2011-07-14: qty 5

## 2011-07-14 MED ORDER — FUROSEMIDE 40 MG PO TABS
40.0000 mg | ORAL_TABLET | Freq: Every day | ORAL | Status: DC
  Administered 2011-07-15 – 2011-07-17 (×3): 40 mg via ORAL
  Filled 2011-07-14 (×3): qty 1

## 2011-07-14 MED ORDER — SODIUM CHLORIDE 0.9 % IV SOLN
INTRAVENOUS | Status: DC
  Administered 2011-07-14 – 2011-07-15 (×2): via INTRAVENOUS
  Administered 2011-07-16: 50 mL/h via INTRAVENOUS
  Administered 2011-07-17: 16:00:00 via INTRAVENOUS

## 2011-07-14 MED ORDER — FERROUS SULFATE 325 (65 FE) MG PO TABS
325.0000 mg | ORAL_TABLET | Freq: Two times a day (BID) | ORAL | Status: DC
  Administered 2011-07-15 – 2011-07-17 (×6): 325 mg via ORAL
  Filled 2011-07-14 (×8): qty 1

## 2011-07-14 MED ORDER — ENOXAPARIN SODIUM 40 MG/0.4ML ~~LOC~~ SOLN
40.0000 mg | SUBCUTANEOUS | Status: DC
  Administered 2011-07-14 – 2011-07-15 (×2): 40 mg via SUBCUTANEOUS
  Filled 2011-07-14 (×4): qty 0.4

## 2011-07-14 MED ORDER — TRAVOPROST (BAK FREE) 0.004 % OP SOLN
1.0000 [drp] | Freq: Every day | OPHTHALMIC | Status: DC
  Administered 2011-07-14 – 2011-07-16 (×3): 1 [drp] via OPHTHALMIC
  Filled 2011-07-14 (×2): qty 2.5

## 2011-07-14 MED ORDER — CLONIDINE HCL 0.2 MG PO TABS
0.2000 mg | ORAL_TABLET | Freq: Two times a day (BID) | ORAL | Status: DC
  Administered 2011-07-14 – 2011-07-15 (×3): 0.2 mg via ORAL
  Filled 2011-07-14 (×5): qty 1

## 2011-07-14 MED ORDER — CLONIDINE HCL 0.1 MG PO TABS
0.2000 mg | ORAL_TABLET | Freq: Once | ORAL | Status: AC
  Administered 2011-07-14: 0.2 mg via ORAL
  Filled 2011-07-14 (×2): qty 1

## 2011-07-14 MED ORDER — ATENOLOL 12.5 MG HALF TABLET
12.5000 mg | ORAL_TABLET | Freq: Every day | ORAL | Status: DC
  Filled 2011-07-14: qty 1

## 2011-07-14 MED ORDER — SODIUM CHLORIDE 0.9 % IJ SOLN
3.0000 mL | Freq: Two times a day (BID) | INTRAMUSCULAR | Status: DC
  Administered 2011-07-14 – 2011-07-15 (×2): 3 mL via INTRAVENOUS

## 2011-07-14 MED ORDER — AMLODIPINE BESYLATE 5 MG PO TABS
5.0000 mg | ORAL_TABLET | Freq: Every day | ORAL | Status: DC
  Administered 2011-07-15: 5 mg via ORAL
  Filled 2011-07-14 (×2): qty 1

## 2011-07-14 MED ORDER — PANTOPRAZOLE SODIUM 40 MG PO TBEC
40.0000 mg | DELAYED_RELEASE_TABLET | Freq: Every day | ORAL | Status: DC
  Administered 2011-07-14 – 2011-07-17 (×4): 40 mg via ORAL
  Filled 2011-07-14 (×5): qty 1

## 2011-07-14 MED ORDER — ATENOLOL 12.5 MG HALF TABLET
12.5000 mg | ORAL_TABLET | Freq: Every day | ORAL | Status: DC
  Administered 2011-07-15: 12.5 mg via ORAL
  Filled 2011-07-14: qty 1

## 2011-07-14 MED ORDER — ISOSORBIDE MONONITRATE ER 60 MG PO TB24: 60.0000 mg | ORAL_TABLET | Freq: Every day | ORAL | Status: DC

## 2011-07-14 NOTE — ED Notes (Signed)
Not acting herself for x 1 week. Couple of near falls yesterday.  Alert and oriented x 4 via EMS assessment.

## 2011-07-14 NOTE — ED Provider Notes (Addendum)
History     CSN: [id number]  Arrival date & time 07/14/11  1119   First MD Initiated Contact with Patient 07/14/11 1135      Chief Complaint  Patient presents with  . Altered Mental Status   HPI Patient presents with 1-2 d of decreased coordination, two "near falls" (yesterady, and 1 week ago), increased confusion (thought she saw a man outside her home yesterday, maintains story today). History of "pleasant confusion" with UTIs, per granddaughter.  No recent trauma or illness.  Denies dysuria, dizziness, vision changes, fever & HA.  Daughter has noted less urine production recently.  She is intermittently incontinent to urine.  At baseline, pt is able to transfer with assistance, occasionally uses a walker, but wheel chair bound generally.  Patient is accompanied by daughter and grand daughter Conservation officer, nature)   Past Medical History  Diagnosis Date  . MI (myocardial infarction)   . Ventricular arrhythmia   . Hypertension   . CHF (congestive heart failure)   . Hyperlipemia   . GERD (gastroesophageal reflux disease)     Past Surgical History  Procedure Date  . Glaucoma surgery     No family history on file.  History  Substance Use Topics  . Smoking status: Not on file  . Smokeless tobacco: Not on file  . Alcohol Use: No    Review of Systems  Constitutional: Negative for fever, chills, diaphoresis, appetite change and fatigue.  HENT: Negative for ear pain, congestion, rhinorrhea, sneezing, trouble swallowing, neck stiffness, postnasal drip and tinnitus.   Eyes: Negative for visual disturbance.  Respiratory: Negative for cough, chest tightness, shortness of breath and wheezing.   Cardiovascular: Negative for chest pain, palpitations and leg swelling.  Gastrointestinal: Negative for nausea, vomiting, abdominal pain, diarrhea, constipation, blood in stool and abdominal distention.  Genitourinary: Negative for dysuria, urgency, frequency, flank pain and difficulty  urinating.    Allergies  Review of patient's allergies indicates no known allergies.  Home Medications   Current Outpatient Rx  Name Route Sig Dispense Refill  . AMLODIPINE BESYLATE 10 MG PO TABS Oral Take 5 mg by mouth daily.    . ASPIRIN EC 81 MG PO TBEC Oral Take 81 mg by mouth daily.    . ATENOLOL 25 MG PO TABS Oral Take 12.5 mg by mouth daily.    . AZELASTINE HCL 0.05 % OP SOLN Right Eye Place 1 drop into the right eye 2 (two) times daily.    Marland Kitchen CLONIDINE HCL 0.2 MG PO TABS Oral Take 0.2 mg by mouth 2 (two) times daily.    . FENTANYL 50 MCG/HR TD PT72 Transdermal Place 1 patch onto the skin every 3 (three) days.    Di Kindle SULFATE 325 (65 FE) MG PO TABS Oral Take 325 mg by mouth 2 (two) times daily with a meal.    . OMEGA-3 FATTY ACIDS 1000 MG PO CAPS Oral Take 1 g by mouth daily.    . FUROSEMIDE 40 MG PO TABS Oral Take 40 mg by mouth daily.    . ISOSORBIDE MONONITRATE ER 60 MG PO TB24 Oral Take 60 mg by mouth daily.    Marland Kitchen LOSARTAN POTASSIUM 100 MG PO TABS Oral Take 100 mg by mouth daily.    . ADULT MULTIVITAMIN W/MINERALS CH Oral Take 1 tablet by mouth daily.    Marland Kitchen OMEPRAZOLE 20 MG PO CPDR Oral Take 20 mg by mouth 2 (two) times daily.    Marland Kitchen POTASSIUM CHLORIDE 20 MEQ/15ML (10%) PO LIQD  Oral Take 20 mEq by mouth daily.     Marland Kitchen ROSUVASTATIN CALCIUM 10 MG PO TABS Oral Take 10 mg by mouth daily.    . TRAVOPROST 0.004 % OP SOLN Both Eyes Place 1 drop into both eyes at bedtime.      BP 213/86  Pulse 100  Temp(Src) 98.5 F (36.9 C) (Oral)  Resp 18  SpO2 97%  Physical Exam  Vitals reviewed. Constitutional: She appears well-developed and well-nourished. No distress.  Eyes: EOM are normal. Pupils are equal, round, and reactive to light.  Neck: Normal range of motion. Neck supple. No JVD present. No thyromegaly present.  Cardiovascular: Regular rhythm and intact distal pulses.  Exam reveals no gallop and no friction rub.   Murmur heard.      Tachycardic   Pulmonary/Chest: Effort  normal and breath sounds normal. No respiratory distress. She has no wheezes. She has no rales. She exhibits no tenderness.  Abdominal: Soft. Bowel sounds are normal. She exhibits no distension and no mass. There is no tenderness. There is no rebound and no guarding.  Musculoskeletal: Normal range of motion. She exhibits no edema and no tenderness.  Neurological: She is alert.       Oriented to person, birthday, place; not oriented to time; grand daughter notes that she is generally sharp and would be able to answer questions without problem; patient's EOM seem to be intact, but she has difficulty following instructions to track  Skin: Skin is warm and dry. She is not diaphoretic.  Psychiatric: She has a normal mood and affect.    ED Course  Procedures (including critical care time)  Labs Reviewed  URINALYSIS, ROUTINE W REFLEX MICROSCOPIC - Abnormal; Notable for the following:    Protein, ur 30 (*)    All other components within normal limits  BASIC METABOLIC PANEL - Abnormal; Notable for the following:    Calcium 11.7 (*)    GFR calc non Af Amer 58 (*)    GFR calc Af Amer 67 (*)    All other components within normal limits  CBC - Abnormal; Notable for the following:    RBC 3.49 (*)    Hemoglobin 10.1 (*)    HCT 32.4 (*)    All other components within normal limits  HEPATIC FUNCTION PANEL - Abnormal; Notable for the following:    Albumin 3.3 (*)    Total Bilirubin 0.2 (*)    All other components within normal limits  DIFFERENTIAL  URINE MICROSCOPIC-ADD ON  LACTIC ACID, PLASMA  PROTIME-INR  APTT  TROPONIN I   Ct Head Wo Contrast  07/14/2011  *RADIOLOGY REPORT*  Clinical Data: Altered mental status  CT HEAD WITHOUT CONTRAST  Technique:  Contiguous axial images were obtained from the base of the skull through the vertex without contrast.  Comparison: CT 05/22/2010  Findings: Age appropriate atrophy.  Chronic microvascular ischemia in the white matter.  No acute infarct.  Negative  for hemorrhage.  7 mm calcification adjacent the left frontal bone is unchanged and may represent a small calcified meningioma.  Calvarium is intact.  IMPRESSION: Atrophy and chronic microvascular ischemia.  No acute abnormality.  Original Report Authenticated By: Camelia Phenes, M.D.   Dg Chest Port 1 View  07/14/2011  *RADIOLOGY REPORT*  Clinical Data: Altered mental status  PORTABLE CHEST - 1 VIEW  Comparison:  01/14/2007  Findings: Cardiomegaly again noted.  Stable central vascular congestion without convincing pulmonary edema.  Stable scarring in the lingula.  IMPRESSION: No active  disease.  No significant change  Original Report Authenticated By: Natasha Mead, M.D.    Date: 07/14/2011  Rate: 62  Rhythm: normal sinus rhythm  QRS Axis: normal  Intervals: normal  ST/T Wave abnormalities: ST elevations anteriorly, unchanged from 2008  Conduction Disutrbances:none  Narrative Interpretation:   Old EKG Reviewed: unchanged    1. Delirium   2. Hypercalcemia       MDM  Patient is a high functioning 78 yo F who presents with pleasantly confused AMS (acute onset delirium) that is different from her baseline per family.  Home meds given in ED to manage BP.  UA & CXR negative. No acute pathology on CT head.  No significant electrolyte abnormalities (except borderline elevated Ca, 11.7).  Pt's status needs to be further monitored inpt.   Filed Vitals:   07/14/11 1415  BP: 154/106  Pulse: 88  Temp:   Resp:           Vernice Jefferson, MD 07/14/11 1610  Vernice Jefferson, MD 07/16/11 2354

## 2011-07-14 NOTE — ED Notes (Signed)
Hx. Of uti and incontinence.

## 2011-07-14 NOTE — ED Provider Notes (Addendum)
  I performed a history and physical examination of Brittany [name] and discussed her management with Dr. Milbert Coulter.  I agree with the history, physical, assessment, and plan of care, with the following exceptions: None  I was present for the following procedures: None Time Spent in Critical Care of the patient: None Time spent in discussions with the patient and family: 15 min  Patient with 1-2 days of increased coordination difficulties with 2 near falls. She also has had increased confusion and hallucinations. She states that she saw me outside of her house and that I instructed her to come to the doctor. She has a history of having pleasant confusion with urinary tract infections in the past. She is oriented only to herself.  Remainder of her exam is unremarkable  Urine is negative. Her laboratory studies are relatively unremarkable except for slightly elevated calcium level of 0.7. CT and chest x-ray unremarkable. EKG unremarkable. Will be admitted for further evaluation and treatment. Discussed with Dr. Felipa Eth with [facility] who will admit the patient.  Patient with evidence of delirium on admission   Tildon Husky, MD 07/14/11 1603  Dayton Bailiff, MD 07/23/11 2214

## 2011-07-14 NOTE — Progress Notes (Signed)
PCP:   Guerry Bruin   Chief Complaint:  AMS and Confusion; diffuse weakness  HPI: Patient presents to the emergency room with family, complaining of worsening mental status changes, confusion, hallucinations, questionably delirium or least delusions with respect to seeing people outside of her door, nor are there or visualized by family members. Also 2 near falls, possibly sliding out from her wheelchair where she normally resides. He is able to assist with transfers from her wheelchair, occasionally uses a walker. States that she is usually mentally sharp, able to remember dates, birthdays, where she is, completely oriented. However that over the last one to 2 days she's had increasing confusion and eating but marginally, less urine production, or mentally incontinence, she does have a history of altered mental status with UTIs in the past per granddaughter's report, who is a Publishing rights manager with a cardiology group in [place].   Review of Systems:  Mostly obtained from the family members, including granddaughter and daughter, negative for fevers, chills, headaches, focal neurological deficits however has had some diffuse weakness, and it including 2 episodes of near falls from the wheelchair, requiring more assistance, denies any difficulty swallowing, coughing, chest pain, shortness of breath, wheezing, visual abnormalities except for seeing things that aren't there, negative for nausea, vomiting, change in bowel habits, specifically constipation, specifically blood in urine or stool, negative for worsening edema, possibly a decrease in caloric intake as well as fluid intake over the last few days.  Past Medical History: Past Medical History  Diagnosis Date  . MI (myocardial infarction)   . Ventricular arrhythmia   . Hypertension   . CHF (congestive heart failure)   . Hyperlipemia   . GERD (gastroesophageal reflux disease)    Past Surgical History  Procedure Date  . Glaucoma  surgery     Medications: Prior to Admission medications   Medication Sig Start Date End Date Taking? Authorizing Provider  amLODipine (NORVASC) 10 MG tablet Take 5 mg by mouth daily.   Yes Historical Provider, MD  aspirin EC 81 MG tablet Take 81 mg by mouth daily.   Yes Historical Provider, MD  atenolol (TENORMIN) 25 MG tablet Take 12.5 mg by mouth daily.   Yes Historical Provider, MD  azelastine (OPTIVAR) 0.05 % ophthalmic solution Place 1 drop into the right eye 2 (two) times daily.   Yes Historical Provider, MD  cloNIDine (CATAPRES) 0.2 MG tablet Take 0.2 mg by mouth 2 (two) times daily.   Yes Historical Provider, MD  fentaNYL (DURAGESIC - DOSED MCG/HR) 50 MCG/HR Place 1 patch onto the skin every 3 (three) days.   Yes Historical Provider, MD  ferrous sulfate 325 (65 FE) MG tablet Take 325 mg by mouth 2 (two) times daily with a meal.   Yes Historical Provider, MD  fish oil-omega-3 fatty acids 1000 MG capsule Take 1 g by mouth daily.   Yes Historical Provider, MD  furosemide (LASIX) 40 MG tablet Take 40 mg by mouth daily.   Yes Historical Provider, MD  isosorbide mononitrate (IMDUR) 60 MG 24 hr tablet Take 60 mg by mouth daily.   Yes Historical Provider, MD  losartan (COZAAR) 100 MG tablet Take 100 mg by mouth daily.   Yes Historical Provider, MD  Multiple Vitamin (MULITIVITAMIN WITH MINERALS) TABS Take 1 tablet by mouth daily.   Yes Historical Provider, MD  omeprazole (PRILOSEC) 20 MG capsule Take 20 mg by mouth 2 (two) times daily.   Yes Historical Provider, MD  potassium chloride 20 MEQ/15ML (10%) solution  Take 20 mEq by mouth daily.    Yes Historical Provider, MD  rosuvastatin (CRESTOR) 10 MG tablet Take 10 mg by mouth daily.   Yes Historical Provider, MD  travoprost, benzalkonium, (TRAVATAN) 0.004 % ophthalmic solution Place 1 drop into both eyes at bedtime.   Yes Historical Provider, MD    Allergies:   Allergies  Allergen Reactions  . Pineapple Swelling    Social History:  does  not have a smoking history on file. She does not have any smokeless tobacco history on file. She reports that she does not drink alcohol or use illicit drugs. currently resides with her daughter with close assistance by outside help as well as a granddaughter who is a NP  Family History: No family history on file.  Physical Exam: Filed Vitals:   07/14/11 1345 07/14/11 1400 07/14/11 1415 07/14/11 1637  BP: 135/90 144/128 154/106 120/67  Pulse: 83 78 88 74  Temp:      TempSrc:      Resp:    18  SpO2: 97% 98% 98% 92%   Physical  exam Alec atraumatic, sclera anicteric, extraocular movements appear intact, pupils are reactive to light and accommodation Neck is supple, no cervical lymphadenopathy appreciated, no thyromegaly present Lungs are clear to auscultation bilaterally with no respiratory distress or accessory muscle usage Cardiovascular exam reveals regular rate and rhythm with min murmurs appreciated Soft, nontender, nondistended, bowel sounds are present Reveal no evidence of cyanosis, significant edema, possibly trace, no active synovitis, patient can move all 4 extremities Is alert to person, birth date but not year, place, difficult with hearing. Somewhat Difficult to Understand in the absence of her dentures. No tremors  Labs on Admission:   [facility] 07/14/11 1227  NA 141  K 3.9  CL 107  CO2 26  GLUCOSE 92  BUN 18  CREATININE 0.82  CALCIUM 11.7*  MG --  PHOS --    Basename 07/14/11 1341  AST 19  ALT 8  ALKPHOS 75  BILITOT 0.2*  PROT 7.9  ALBUMIN 3.3*   No results found for this basename: LIPASE:2,AMYLASE:2 in the last 72 hours  Basename 07/14/11 1227  WBC 6.5  NEUTROABS 4.6  HGB 10.1*  HCT 32.4*  MCV 92.8  PLT 281    Basename 07/14/11 1341  CKTOTAL --  CKMB --  CKMBINDEX --  TROPONINI <0.30   No results found for this basename: TSH,T4TOTAL,FREET3,T3FREE,THYROIDAB in the last 72 hours No results found for this basename:  VITAMINB12:2,FOLATE:2,FERRITIN:2,TIBC:2,IRON:2,RETICCTPCT:2 in the last 72 hours  Radiological Exams on Admission: Ct Head Wo Contrast  07/14/2011  *RADIOLOGY REPORT*  Clinical Data: Altered mental status  CT HEAD WITHOUT CONTRAST  Technique:  Contiguous axial images were obtained from the base of the skull through the vertex without contrast.  Comparison: CT 05/22/2010  Findings: Age appropriate atrophy.  Chronic microvascular ischemia in the white matter.  No acute infarct.  Negative for hemorrhage.  7 mm calcification adjacent the left frontal bone is unchanged and may represent a small calcified meningioma.  Calvarium is intact.  IMPRESSION: Atrophy and chronic microvascular ischemia.  No acute abnormality.  Original Report Authenticated By: Camelia Phenes, M.D.   Dg Chest Port 1 View  07/14/2011  *RADIOLOGY REPORT*  Clinical Data: Altered mental status  PORTABLE CHEST - 1 VIEW  Comparison:  01/14/2007  Findings: Cardiomegaly again noted.  Stable central vascular congestion without convincing pulmonary edema.  Stable scarring in the lingula.  IMPRESSION: No active disease.  No significant change  Original Report Authenticated By: Natasha Mead, M.D.   Orders placed during the hospital encounter of 07/14/11  . ED EKG  . ED EKG    Assessment/Plan Altered mental status-unclear etiology, possibly secondary to mild hypercalcemia, clearly different from baseline after extensive discussion with the family, certainly mild chronic microvascular ischemia and atrophy that may be playing a role I given her age of 41. No evidence of secondary infection especially when evaluating urinalysis, chest x-ray as well as head CT, no focal neurological deficits present at this time but clearly the patient is a different from baseline based on granddaughter who is a nurse practitioner's report. Mild hypercalcemia, unclear baseline, will clearly work this up into Mr. IV fluids, will to obtain an ionized calcium, PTH with  calcium, and recheck other electrolytes in the morning including thyroid function. Area artery disease, with history of MI, currently hemodynamically stable, no issues with at significant abnormalities on EKG and no evidence of volume overload on physical exam. Telemetry has been uneventful with a normal set of cardiac enzymes History of congestive heart fair, clinically compensated at this time History of reflux, continue proton pump inhibitor, hemoglobin slightly low, we'll need to compare with baseline. Disposition-pending outcomes of altered mental status workup including that for her hypercalcemia. Clearly a baseline patient does need significant assistance and indeed has the help of outside paid help as well as that of her daughter. Usually wheelchair bound and dependent on others for his ADLs.  Zethan Alfieri R 07/14/2011, 5:23 PM

## 2011-07-14 NOTE — ED Notes (Signed)
Heart Healthy meal req

## 2011-07-15 ENCOUNTER — Encounter (HOSPITAL_COMMUNITY): Payer: Self-pay | Admitting: *Deleted

## 2011-07-15 LAB — CBC
HCT: 25.2 % — ABNORMAL LOW (ref 36.0–46.0)
Hemoglobin: 7.9 g/dL — ABNORMAL LOW (ref 12.0–15.0)
MCHC: 31.3 g/dL (ref 30.0–36.0)
MCV: 91.3 fL (ref 78.0–100.0)
RDW: 14.8 % (ref 11.5–15.5)

## 2011-07-15 LAB — COMPREHENSIVE METABOLIC PANEL
ALT: 5 U/L (ref 0–35)
AST: 14 U/L (ref 0–37)
Albumin: 2.5 g/dL — ABNORMAL LOW (ref 3.5–5.2)
Alkaline Phosphatase: 59 U/L (ref 39–117)
Calcium: 10.5 mg/dL (ref 8.4–10.5)
GFR calc Af Amer: 57 mL/min — ABNORMAL LOW (ref >90)
Glucose, Bld: 83 mg/dL (ref 70–99)
Potassium: 4.6 mEq/L (ref 3.5–5.1)
Sodium: 142 mEq/L (ref 135–145)
Total Protein: 6.1 g/dL (ref 6.0–8.3)

## 2011-07-15 LAB — CALCIUM, IONIZED: Calcium, Ion: 1.48 mmol/L — ABNORMAL HIGH (ref 1.12–1.32)

## 2011-07-15 LAB — PHOSPHORUS: Phosphorus: 3.4 mg/dL (ref 2.3–4.6)

## 2011-07-15 LAB — VITAMIN B12: Vitamin B-12: 1111 pg/mL — ABNORMAL HIGH (ref 211–911)

## 2011-07-15 LAB — MAGNESIUM: Magnesium: 1.9 mg/dL (ref 1.5–2.5)

## 2011-07-15 LAB — TSH: TSH: 1.776 u[IU]/mL (ref 0.350–4.500)

## 2011-07-15 MED ORDER — BARRIER CREAM NON-SPECIFIED
1.0000 "application " | TOPICAL_CREAM | Freq: Two times a day (BID) | TOPICAL | Status: DC | PRN
  Filled 2011-07-15: qty 1

## 2011-07-15 NOTE — Progress Notes (Signed)
Pt has skin breakdown under bil breast and to abdominal folds- microguard and alleyvn are already in use.  Pt also has a fissure between buttocks EPC cream in applied. Pts granddaughter Angelica Chessman at the bedside and states she was aware of breakdown to bil breast and to abd folds and states pt had this prior to arrival but was unaware of fissure. Dr. Felipa Eth on the unit this AM and states he will order a WOC consult for eval and treat. Julien Nordmann [facility]

## 2011-07-15 NOTE — Progress Notes (Signed)
Broken skin noted under breasts and in abdominal folds. Nitroguard and mepilex applied. Will continue to monitor.

## 2011-07-15 NOTE — Progress Notes (Addendum)
Subjective: Patient still somewhat confused overnight, but somewhat better compared to family report, now reporting when she needs to use the bathroom, is able to follow commands, and is able to speak much better with her dentures in place. Patient denies any shortness of breath, cough, chest pain, muscle spasms but family reports diffuse weakness continues but nonfocal area multiple family members present. Denies nausea, vomiting, able to swallow fluids without hindrance however some issues with swallowing food or least pocketing food per family report. Also issues with worsening skin breakdown underneath the breast, hip area as well as at the sacrum. No fevers, chills. No apparent distress and quite jovial this morning.  Objective: Vital signs in last 24 hours: Temp:  [98 F (36.7 C)-98.5 F (36.9 C)] 98.4 F (36.9 C) (02/10 0509) Pulse Rate:  [53-101] 78  (02/10 0937) Resp:  [12-23] 18  (02/10 0509) BP: (96-217)/(41-128) 154/71 mmHg (02/10 0937) SpO2:  [92 %-100 %] 97 % (02/10 0509) Weight:  [66.6 kg (146 lb 13.2 oz)] 66.6 kg (146 lb 13.2 oz) (02/09 1900) Weight change:  Last BM Date: 07/14/11  CBG (last 3)  No results found for this basename: GLUCAP:3 in the last 72 hours  Intake/Output from previous day: 02/09 0701 - 02/10 0700 In: 1531.3 [I.V.:1531.3] Out: -  Intake/Output this shift:   Physical exam: No apparent distress, answering questions appropriately, somewhat hard of hearing, moves all 4 extremities albeit with some hindrance with respect to range of motion No oropharyngeal lesions, neck is supple, no cervical lymphadenopathy Cardiovascular reveals a regular rate and rhythm Lungs are clear to auscultation with no respiratory distress, question Rales at the bases Abdomen soft, nontender, nondistended No significant edema, cyanosis, pedal pulses intact, patient can move all 4 extremities albeit not with full range of motion Patient alert, appropriate, answering questions,  acknowledging all family present however family did report some confusion last night.    Lab Results:  Basename 07/15/11 0500 07/14/11 2115 07/14/11 1227  NA 142 -- 141  K 4.6 -- 3.9  CL 108 -- 107  CO2 28 -- 26  GLUCOSE 83 -- 92  BUN 19 -- 18  CREATININE 0.94 0.80 --  CALCIUM 10.5 -- 11.7*  MG 1.9 -- --  PHOS 3.4 -- --    Basename 07/15/11 0500 07/14/11 1341  AST 14 19  ALT 5 8  ALKPHOS 59 75  BILITOT 0.2* 0.2*  PROT 6.1 7.9  ALBUMIN 2.5* 3.3*    Basename 07/15/11 0500 07/14/11 2115 07/14/11 1227  WBC 4.9 6.6 --  NEUTROABS -- -- 4.6  HGB 7.9* 8.8* --  HCT 25.2* 27.9* --  MCV 91.3 91.5 --  PLT 231 257 --   Magnesium normal at 1.9, phosphorus normal at 3.4, orders pending include ionized calcium, PTH  Basename 07/14/11 1341  CKTOTAL --  CKMB --  CKMBINDEX --  TROPONINI <0.30   No results found for this basename: TSH,T4TOTAL,FREET3,T3FREE,THYROIDAB in the last 72 hours No results found for this basename: VITAMINB12:2,FOLATE:2,FERRITIN:2,TIBC:2,IRON:2,RETICCTPCT:2 in the last 72 hours  Studies/Results: Ct Head Wo Contrast  07/14/2011  *RADIOLOGY REPORT*  Clinical Data: Altered mental status  CT HEAD WITHOUT CONTRAST  Technique:  Contiguous axial images were obtained from the base of the skull through the vertex without contrast.  Comparison: CT 05/22/2010  Findings: Age appropriate atrophy.  Chronic microvascular ischemia in the white matter.  No acute infarct.  Negative for hemorrhage.  7 mm calcification adjacent the left frontal bone is unchanged and may represent a small  calcified meningioma.  Calvarium is intact.  IMPRESSION: Atrophy and chronic microvascular ischemia.  No acute abnormality.  Original Report Authenticated By: Camelia Phenes, M.D.   Dg Chest Port 1 View  07/14/2011  *RADIOLOGY REPORT*  Clinical Data: Altered mental status  PORTABLE CHEST - 1 VIEW  Comparison:  01/14/2007  Findings: Cardiomegaly again noted.  Stable central vascular congestion  without convincing pulmonary edema.  Stable scarring in the lingula.  IMPRESSION: No active disease.  No significant change  Original Report Authenticated By: Natasha Mead, M.D.     Medications: Scheduled:   . amLODipine  5 mg Oral Once  . amLODipine  5 mg Oral Daily  . aspirin EC  81 mg Oral Daily  . cloNIDine  0.2 mg Oral Once  . cloNIDine  0.2 mg Oral BID  . enoxaparin  40 mg Subcutaneous Q24H  . fentaNYL  50 mcg Transdermal Q72H  . ferrous sulfate  325 mg Oral BID WC  . furosemide  40 mg Oral Once  . furosemide  40 mg Oral Daily  . isosorbide mononitrate  60 mg Oral Daily  . losartan  100 mg Oral Daily  . mulitivitamin with minerals  1 tablet Oral Daily  . olopatadine  1 drop Right Eye BID  . pantoprazole  40 mg Oral Q1200  . potassium chloride  20 mEq Oral Daily  . rosuvastatin  10 mg Oral q1800  . sodium chloride  3 mL Intravenous Q12H  . atenolol  12.5 mg Oral To Major  . atenolol  12.5 mg Oral Daily  . Travoprost (BAK Free)  1 drop Both Eyes QHS  . DISCONTD: amLODipine  5 mg Oral Daily  . DISCONTD: azelastine  1 drop Right Eye BID  . DISCONTD: azelastine  1 drop Right Eye BID  . DISCONTD: furosemide  40 mg Oral Daily  . DISCONTD: isosorbide mononitrate  60 mg Oral Daily  . DISCONTD: losartan  100 mg Oral Daily  . DISCONTD: atenolol  12.5 mg Oral Daily  . DISCONTD: travoprost (benzalkonium)  1 drop Both Eyes QHS   Continuous:   . sodium chloride 125 mL/hr at 07/15/11 4782    Assessment/Plan: Altered mental status-possibly improved overnight, but still different from baseline per family report, question mild hypercalcemia, with calcium level improving overnight, physical exam, head CT, chest x-ray, urinalysis all fairly unremarkable for any acute issues, question age-related issues. Mild hypercalcemia,-resolved overnight, PTH with calcium, ionized calcium pending, improved with IV fluids, will decrease rate of IV fluids this morning again questioning rails at the bases  of the lungs, no respiratory distress, or hypoxia thyroid function pending. Coronary artery disease with history of MI, currently hemodynamically stable again we'll watch for evidence of volume overload, will decrease IV fluids History congestive heart failure, see discussion above Skin breakdown, will obtain wound care nurse consult Dysphasia for solids or at least pocketing of food, will obtain speech therapy consult Physical deconditioning, will obtain PT and OT consult-may need home health. Family does provide most of her care 24 hours a day.    LOS: 1 day   Naela Nodal R 07/15/2011, 10:24 AM  Addendum-relative anemia with IV fluids, no evidence of hypoxia, will recheck CBC in the morning, presented this is secondary to multiple factors, will check iron panel in the morning.

## 2011-07-15 NOTE — Consults (Signed)
WOC consult Note Reason for Consult: Intertriginous dermatitis (consistent with fungal overgrowth) in inframammary region, in folds of panus and in buttock crease. Wound type:MASD (Moisture-associated skin damage), ITD (intertriginous dermatitis) Wound bed: bilateral inframammary folds, right panus, apex of gluteal crease all present as moist, pink and painful Measurements: .4cm x 12cm x .2cm open area beneath bilateral breasts, .2cm x 10cm x .2cm open area at right panus. Reddened area (vulnerable) at apex of buttock crease. Drainage (amount, consistency, odor) None;  Periwound:intact, mild erythema Dressing procedure/placement/frequency: InterDry Ag+ textile ordered for use in wicking away excess moisture and providing an antimicrobial environment for healing these areas. I will not follow, but will remain available as needed.  Please re-consult if needed. Thanks, Ladona Mow, MSN, RN, [facility], CWOCN [phone])

## 2011-07-15 NOTE — Progress Notes (Addendum)
Pt in bed sleeping HR dropped to 39 on tele, pt now awake HR now running 46-56 on the monitor. Pt BP 116/56 manually, pt asymptomatic. Dr. Felipa Eth paged to notify awaiting return call. Julien Nordmann [facility]

## 2011-07-15 NOTE — Progress Notes (Signed)
Return call received from Dr. Felipa Eth continue to monitor pt and MD to round in AM to address. Julien Nordmann [facility]

## 2011-07-16 LAB — PREPARE RBC (CROSSMATCH)

## 2011-07-16 LAB — COMPREHENSIVE METABOLIC PANEL
AST: 17 U/L (ref 0–37)
Albumin: 2.6 g/dL — ABNORMAL LOW (ref 3.5–5.2)
Alkaline Phosphatase: 65 U/L (ref 39–117)
BUN: 22 mg/dL (ref 6–23)
CO2: 25 mEq/L (ref 19–32)
Chloride: 108 mEq/L (ref 96–112)
GFR calc non Af Amer: 38 mL/min — ABNORMAL LOW (ref >90)
Potassium: 4.3 mEq/L (ref 3.5–5.1)
Total Bilirubin: 0.2 mg/dL — ABNORMAL LOW (ref 0.3–1.2)

## 2011-07-16 LAB — CBC
HCT: 25.3 % — ABNORMAL LOW (ref 36.0–46.0)
Hemoglobin: 7.9 g/dL — ABNORMAL LOW (ref 12.0–15.0)
RBC: 2.76 MIL/uL — ABNORMAL LOW (ref 3.87–5.11)

## 2011-07-16 LAB — PTH, INTACT AND CALCIUM: Calcium, Total (PTH): 10 mg/dL (ref 8.4–10.5)

## 2011-07-16 MED ORDER — CLONIDINE HCL 0.2 MG PO TABS
0.2000 mg | ORAL_TABLET | Freq: Two times a day (BID) | ORAL | Status: DC
  Administered 2011-07-16 (×2): 0.2 mg via ORAL
  Filled 2011-07-16 (×4): qty 1

## 2011-07-16 MED ORDER — ACETAMINOPHEN 325 MG PO TABS
650.0000 mg | ORAL_TABLET | Freq: Once | ORAL | Status: AC
  Administered 2011-07-16: 650 mg via ORAL
  Filled 2011-07-16: qty 2

## 2011-07-16 MED ORDER — AMLODIPINE BESYLATE 5 MG PO TABS
5.0000 mg | ORAL_TABLET | Freq: Every day | ORAL | Status: DC
  Administered 2011-07-16: 5 mg via ORAL
  Filled 2011-07-16 (×2): qty 1

## 2011-07-16 MED ORDER — CLONIDINE HCL 0.2 MG PO TABS
0.2000 mg | ORAL_TABLET | Freq: Once | ORAL | Status: AC
  Administered 2011-07-16: 0.2 mg via ORAL
  Filled 2011-07-16: qty 1

## 2011-07-16 NOTE — Progress Notes (Signed)
Speech Language/Pathology Clinical/Bedside Swallow Evaluation Patient Details  Name: JAYLIAH BENETT MRN: [id number] DOB: [birth year] Today's Date: 07/16/2011  Past Medical History:  Past Medical History  Diagnosis Date  . MI (myocardial infarction)   . Ventricular arrhythmia   . Hypertension   . CHF (congestive heart failure)   . Hyperlipemia   . GERD (gastroesophageal reflux disease)    Past Surgical History:  Past Surgical History  Procedure Date  . Glaucoma surgery    HPI:  Pt is an 78 year old female admitted with AMS. Family reports pt with difficulty with solid foods with slow mastication, sometimes pocketing foods. Poor PO intake recently. CXR, CT negative. AMS has improved since admit but still not at baseline per family   Assessment/Recommendations/Treatment Plan Suspected Esophageal Findings Suspected Esophageal Findings:  (Throat clearing)  SLP Assessment Clinical Impression Statement: Pt presents with mild oral dysphagia. No overt pocketing observed though mastication and transit of POs was slow. Min verbal cues required to stay on task with oral preparation in distracting environment. Continuous thrat clearing observed prior to and after POs. Family reports pt often awakens clearing her throat. Pt with potential reflux given continuous throat clearing. Will downgrade pts diet to dys 2 to facilitate oral preparation of solid POs. SLP will f/u to determine toelrance of diet and provided further education to family.  Risk for Aspiration: Mild  Swallow Evaluation Recommendations Solid Consistency: Dysphagia 2 (Fine chop) Liquid Consistency: Thin Liquid Administration via: Cup;Straw Medication Administration: Whole meds with liquid Supervision: Full supervision/cueing for compensatory strategies Compensations: Small sips/bites;Slow rate Postural Changes and/or Swallow Maneuvers: Seated upright 90 degrees;Upright 30-60 min after meal  Treatment Plan Speech Therapy  Frequency: min 2x/week Treatment Duration: 2 weeks Interventions: Patient/family education;Diet toleration management by SLP  Prognosis Prognosis for Safe Diet Advancement: Good  Individuals Consulted Consulted and Agree with Results and Recommendations: Family member/caregiver Family Member Consulted: daughter  Swallowing Goals  SLP Swallowing Goals Patient will consume recommended diet without observed clinical signs of aspiration with: Moderate assistance Swallow Study Goal #1 - Progress: Progressing toward goal Patient will utilize recommended strategies during swallow to increase swallowing safety with: Moderate cueing Swallow Study Goal #2 - Progress: Progressing toward goal  Swallow Study Prior Functional Status     General  HPI: Pt is an 78 year old female admitted with AMS. Family reports pt with difficulty with solid foods with slow mastication, sometimes pocketing foods. Poor PO intake recently. CXR, CT negative. AMS has improved since admit but still not at baseline per family Type of Study: Bedside swallow evaluation Diet Prior to this Study: Regular;Thin liquids Temperature Spikes Noted: No Respiratory Status: Room air Behavior/Cognition: Alert;Cooperative;Pleasant mood Oral Cavity - Dentition: Dentures, top;Dentures, bottom Vision: Functional for self-feeding Patient Positioning: Upright in bed Baseline Vocal Quality: Clear Volitional Cough: Strong Volitional Swallow: Able to elicit  Oral Motor/Sensory Function  Overall Oral Motor/Sensory Function: Appears within functional limits for tasks assessed  Consistency Results  Ice Chips Ice chips: Not tested  Thin Liquid Thin Liquid: Within functional limits Presentation: Straw;Self Fed  Nectar Thick Liquid Nectar Thick Liquid: Not tested  Honey Thick Liquid Honey Thick Liquid: Not tested  Puree Puree: Within functional limits  Solid Solid: Impaired Presentation: Spoon Oral Phase Impairments:  Impaired anterior to posterior transit Pharyngeal Phase Impairments: Throat Clearing - Delayed   Lukah Goswami, Riley Nearing 07/16/2011,9:22 AM

## 2011-07-16 NOTE — Clinical Documentation Improvement (Signed)
POA DOCUMENTATION CLARIFICATION QUERY  THIS DOCUMENT IS NOT A PERMANENT PART OF THE MEDICAL RECORD  TO RESPOND TO THE THIS QUERY, FOLLOW THE INSTRUCTIONS BELOW:  1. If needed, update documentation for the patient's encounter via the notes activity.  2. Access this query again and click edit on the In [employer].  3. After updating, or not, click F2 to complete all highlighted (required) fields concerning your review. Select "additional documentation in the medical record" OR "no additional documentation provided".  4. Click Sign note button.  5. The deficiency will fall out of your In Basket *Please let us know if you are not able to complete this workflow by phone or e-mail (listed below).  Please update your documentation within the medical record to reflect your response to this query.                                                                                    07/16/11  Dear Dr. [facility]  ( I am not this doctor)  In a better effort to capture your patient's severity of illness, reflect appropriate length of stay and utilization of resources, a review of the patient medical record has revealed the following indicators.    Based on your clinical judgment, please clarify and document in a progress note and/or discharge summary the clinical condition associated with the following supporting information:  In responding to this query please exercise your independent judgment.  The fact that a query is asked, does not imply that any particular answer is desired or expected. Medicare rules require specification as to whether an inpatient diagnosis was present at the time of admission. If so said diagnosis should be documented in progress notes.     Noted being documented "increased confusion"  and a dx of delirium on the chart please clarify following if either of the following diagnoses,       Acute confusion        or           acute delirium      , were:     _  Present at the time of admission  _ NOT present at the time of inpatient admission and it developed during the inpatient stay  _ Unable to clinically determine whether the condition was present on admission.  X_  Documentation insufficient to determine if condition was present at the time of inpatient admission  You may use possible, probable, or suspect with inpatient documentation. possible, probable, suspected diagnoses MUST be documented at the time of discharge  What is documented is what happened.  No further documentation is necessary    Reviewed:  no additional documentation provided      Thank You,  Lavonda Jumbo  Clinical Documentation Specialist RN, BSN. CDS:  Pager [phone] HIM off [phone]  Health Information Management [place]

## 2011-07-16 NOTE — Plan of Care (Signed)
Problem: Phase II Progression Outcomes Goal: Progress activity as tolerated unless otherwise ordered Outcome: Progressing Up with PT 07/16/11.

## 2011-07-16 NOTE — Progress Notes (Signed)
PIV checked, with GBR, flushed without difficulty. Brittany [name]

## 2011-07-16 NOTE — Progress Notes (Signed)
Subjective: Doing ok this AM,   I spoke by phone with her granddaughter about the plan of care.  Daughter in room, but not really medically minded.  No acute issues overnight.  Memory still not quite where it was.  Discussed her anemia in detail and plan of action for today with all parties.  Objective: Vital signs in last 24 hours: Temp:  [97.9 F (36.6 C)-98.4 F (36.9 C)] 98.4 F (36.9 C) (02/11 0515) Pulse Rate:  [50-82] 82  (02/11 0515) Resp:  [18] 18  (02/11 0515) BP: (116-186)/(56-71) 186/66 mmHg (02/11 0515) SpO2:  [95 %-98 %] 95 % (02/11 0515) Weight change:  Last BM Date: 07/14/11  Intake/Output from previous day: 02/10 0701 - 02/11 0700 In: 1124.2 [P.O.:360; I.V.:764.2] Out: -  Intake/Output this shift:    General appearance: alert, cooperative and appears older than stated age Neck: no adenopathy, no carotid bruit, no JVD, supple, symmetrical, trachea midline and thyroid not enlarged, symmetric, no tenderness/mass/nodules Resp: clear to auscultation bilaterally Cardio: regular rate and rhythm, S1, S2 normal, no murmur, click, rub or gallop GI: soft, non-tender; bowel sounds normal; no masses,  no organomegaly Extremities: extremities normal, atraumatic, no cyanosis or edema Neurologic: Mental status: Alert, oriented, thought content appropriate, slightly slow, but able to identify person/place/circumstance.  Close to baseline  Lab Results:  Basename 07/16/11 0525 07/15/11 0500  WBC 6.0 4.9  HGB 7.9* 7.9*  HCT 25.3* 25.2*  PLT 254 231   BMET  Basename 07/16/11 0525 07/15/11 0500  NA 141 142  K 4.3 4.6  CL 108 108  CO2 25 28  GLUCOSE 104* 83  BUN 22 19  CREATININE 1.16* 0.94  CALCIUM 10.4 10.5    Studies/Results: Ct Head Wo Contrast  07/14/2011  *RADIOLOGY REPORT*  Clinical Data: Altered mental status  CT HEAD WITHOUT CONTRAST  Technique:  Contiguous axial images were obtained from the base of the skull through the vertex without contrast.  Comparison:  CT 05/22/2010  Findings: Age appropriate atrophy.  Chronic microvascular ischemia in the white matter.  No acute infarct.  Negative for hemorrhage.  7 mm calcification adjacent the left frontal bone is unchanged and may represent a small calcified meningioma.  Calvarium is intact.  IMPRESSION: Atrophy and chronic microvascular ischemia.  No acute abnormality.  Original Report Authenticated By: Camelia Phenes, M.D.   Dg Chest Port 1 View  07/14/2011  *RADIOLOGY REPORT*  Clinical Data: Altered mental status  PORTABLE CHEST - 1 VIEW  Comparison:  01/14/2007  Findings: Cardiomegaly again noted.  Stable central vascular congestion without convincing pulmonary edema.  Stable scarring in the lingula.  IMPRESSION: No active disease.  No significant change  Original Report Authenticated By: Natasha Mead, M.D.    Medications:  I have reviewed the patient's current medications. Scheduled:   . acetaminophen  650 mg Oral Once  . amLODipine  5 mg Oral Daily  . aspirin EC  81 mg Oral Daily  . cloNIDine  0.2 mg Oral BID  . fentaNYL  50 mcg Transdermal Q72H  . ferrous sulfate  325 mg Oral BID WC  . furosemide  40 mg Oral Daily  . isosorbide mononitrate  60 mg Oral Daily  . losartan  100 mg Oral Daily  . mulitivitamin with minerals  1 tablet Oral Daily  . olopatadine  1 drop Right Eye BID  . pantoprazole  40 mg Oral Q1200  . potassium chloride  20 mEq Oral Daily  . rosuvastatin  10 mg Oral  [id number]  . sodium chloride  3 mL Intravenous Q12H  . Travoprost (BAK Free)  1 drop Both Eyes QHS  . DISCONTD: amLODipine  5 mg Oral Daily  . DISCONTD: cloNIDine  0.2 mg Oral BID  . DISCONTD: enoxaparin  40 mg Subcutaneous Q24H  . DISCONTD: atenolol  12.5 mg Oral Daily   Continuous:   . sodium chloride 50 mL/hr at 07/15/11 1037   UEA:[id number], acetaminophen, barrier cream  Assessment/Plan: Altered mental status-possibly improved overnight, but still different from baseline per family report, question mild  hypercalcemia, but calcium normalized, physical exam, head CT, chest x-ray, urinalysis all fairly unremarkable for any acute issues, question age-related issues.  Mild hypercalcemia,-resolved, PTH with calcium, ionized calcium pending, improved with IV fluids, thyroid function normal.  Coronary artery disease with history of MI, currently hemodynamically stable. Bradycardia- D/Ced beta blocker this hospitalization.  Will follow vitals. History congestive heart failure, see discussion above  Skin breakdown, wound care has provided info for family and home caretaker to follow. Dysphasia for solids or at least pocketing of food, will obtain speech therapy consult  Physical deconditioning, will obtain PT and OT consult-may need home health. Family does provide most of her care 24 hours a day. Transfuse 2 units PRBC due to maintaining anemia.  Iron levels pending.  May be ACD related as no acuity, but could be accounting for some of her AMS as she does have a level of known atherosclerosis.  May improve her ability to get around as well.,. But will take off Lovenox for DVT prophylaxis and use TED hose. Probable discharge home discussed tomorrow if today is uneventful.   LOS: 2 days   TISOVEC,RICHARD W 07/16/2011, 8:17 AM

## 2011-07-16 NOTE — Evaluation (Signed)
Physical Therapy Evaluation Patient Details Name: Brittany [name] MRN: [id number] DOB: 06-Dec-1910 Today's Date: 07/16/2011  Problem List: There is no problem list on file for this patient.   Past Medical History:  Past Medical History  Diagnosis Date  . MI (myocardial infarction)   . Ventricular arrhythmia   . Hypertension   . CHF (congestive heart failure)   . Hyperlipemia   . GERD (gastroesophageal reflux disease)    Past Surgical History:  Past Surgical History  Procedure Date  . Glaucoma surgery     PT Assessment/Plan/Recommendation PT Assessment Clinical Impression Statement: Pt adm with AMS.  Pt can benefit from skilled PT to maximize I and safety to decr burden of care.  Pt appears to have home support so could most likely return home with HHPT if she is close to baseline. PT Recommendation/Assessment: Patient will need skilled PT in the acute care venue PT Problem List: Decreased strength;Decreased mobility PT Therapy Diagnosis : Generalized weakness PT Plan PT Frequency: Min 3X/week PT Treatment/Interventions: Functional mobility training;Therapeutic exercise;Balance training;Patient/family education PT Recommendation Follow Up Recommendations: Home health PT Equipment Recommended: None recommended by PT PT Goals  Acute Rehab PT Goals PT Goal Formulation: Patient unable to participate in goal setting Time For Goal Achievement: 7 days Pt will go Supine/Side to Sit: with min assist PT Goal: Supine/Side to Sit - Progress: Goal set today Pt will go Sit to Supine/Side: with min assist PT Goal: Sit to Supine/Side - Progress: Goal set today Pt will go Sit to Stand: with mod assist PT Goal: Sit to Stand - Progress: Goal set today Pt will go Stand to Sit: with mod assist PT Goal: Stand to Sit - Progress: Goal set today Pt will Transfer Bed to Chair/Chair to Bed: with mod assist PT Transfer Goal: Bed to Chair/Chair to Bed - Progress: Goal set today  PT  Evaluation Precautions/Restrictions  Precautions Precautions: Fall Restrictions Weight Bearing Restrictions: No Prior Functioning  Home Living Lives With: Family Receives Help From: Family Home Adaptive Equipment: Wheelchair - manual Additional Comments: Unsure of further details. Prior Function Level of Independence: Needs assistance with tranfers Driving: No Vocation: Retired Producer, television/film/video: Awake/alert Overall Cognitive Status: History of cognitive impairments History of Cognitive Impairment: Decline in baseline functioning (per notes) Orientation Level: Oriented to person Sensation/Coordination   Extremity Assessment RLE Strength RLE Overall Strength Comments: grossly 3+/5 LLE Strength LLE Overall Strength Comments: grossly 3+/5 Mobility (including Balance) Bed Mobility Supine to Sit: 3: Mod assist;HOB elevated (Comment degrees) (HOB at 20 degrees) Supine to Sit Details (indicate cue type and reason): verbal/tactile cues for technique and to stay on task Sitting - Scoot to Edge of Bed: 3: Mod assist (used pad to assist) Sitting - Scoot to Edge of Bed Details (indicate cue type and reason): Cues for technique Transfers Sit to Stand: 1: +2 Total assist;Patient percentage (comment);From bed;With upper extremity assist;From chair/3-in-1 (pt = 50%) Sit to Stand Details (indicate cue type and reason): verbal/tactile cues for technique Stand to Sit: 1: +2 Total assist;Patient percentage (comment);To bed;To chair/3-in-1;With upper extremity assist (pt = 50 %) Stand to Sit Details: assist to control descent Stand Pivot Transfers: 1: +2 Total assist;Patient percentage (comment) (pt = 50%) Stand Pivot Transfer Details (indicate cue type and reason): didn't use walker, pt used our forearms for support, pt with flexed posture and difficulty moving feet.  Static Sitting Balance Static Sitting - Balance Support: Bilateral upper extremity supported Static Sitting  - Level of Assistance: 5: Stand by  assistance (on EOB) Static Standing Balance Static Standing - Balance Support: Bilateral upper extremity supported (on walker) Static Standing - Level of Assistance: 1: +2 Total assist;Patient percentage (comment) (pt = 50%) Static Standing - Comment/# of Minutes: 1 minute Exercise    End of Session PT - End of Session Equipment Utilized During Treatment: Gait belt Activity Tolerance: Patient tolerated treatment well Patient left: in chair;with call bell in reach (chair alarm in place) Nurse Communication: Mobility status for transfers;Need for lift equipment (recommended using Stedy for back to bed)  [facility] 07/16/2011, 11:56 AM  [facility] PT [phone]

## 2011-07-17 LAB — COMPREHENSIVE METABOLIC PANEL
Albumin: 2.8 g/dL — ABNORMAL LOW (ref 3.5–5.2)
BUN: 18 mg/dL (ref 6–23)
Calcium: 10.5 mg/dL (ref 8.4–10.5)
Chloride: 105 mEq/L (ref 96–112)
Creatinine, Ser: 0.92 mg/dL (ref 0.50–1.10)
Total Bilirubin: 0.6 mg/dL (ref 0.3–1.2)
Total Protein: 7 g/dL (ref 6.0–8.3)

## 2011-07-17 LAB — TYPE AND SCREEN
ABO/RH(D): O POS
Antibody Screen: NEGATIVE
Unit division: 0

## 2011-07-17 LAB — CBC
HCT: 34.8 % — ABNORMAL LOW (ref 36.0–46.0)
MCHC: 32.5 g/dL (ref 30.0–36.0)
Platelets: 232 10*3/uL (ref 150–400)
RDW: 15 % (ref 11.5–15.5)

## 2011-07-17 LAB — PTH, INTACT AND CALCIUM
Calcium, Total (PTH): 9.8 mg/dL (ref 8.4–10.5)
PTH: 90 pg/mL — ABNORMAL HIGH (ref 14.0–72.0)

## 2011-07-17 MED ORDER — CLONIDINE HCL 0.2 MG PO TABS
0.2000 mg | ORAL_TABLET | Freq: Three times a day (TID) | ORAL | Status: DC
  Administered 2011-07-17 (×2): 0.2 mg via ORAL
  Filled 2011-07-17 (×3): qty 1

## 2011-07-17 MED ORDER — PANTOPRAZOLE SODIUM 40 MG PO TBEC: 40.0000 mg | DELAYED_RELEASE_TABLET | Freq: Every day | ORAL | Status: DC

## 2011-07-17 MED ORDER — ACETAMINOPHEN 325 MG PO TABS: 650.0000 mg | ORAL_TABLET | Freq: Four times a day (QID) | ORAL | Status: AC | PRN

## 2011-07-17 MED ORDER — CLONIDINE HCL 0.2 MG PO TABS: 0.2000 mg | ORAL_TABLET | Freq: Three times a day (TID) | ORAL | Status: DC

## 2011-07-17 MED ORDER — BARRIER CREAM NON-SPECIFIED: 1.0000 "application " | TOPICAL_CREAM | Freq: Two times a day (BID) | TOPICAL | Status: DC | PRN

## 2011-07-17 MED ORDER — AMLODIPINE BESYLATE 10 MG PO TABS
10.0000 mg | ORAL_TABLET | Freq: Every day | ORAL | Status: DC
  Administered 2011-07-17: 10 mg via ORAL
  Filled 2011-07-17: qty 1

## 2011-07-17 NOTE — Discharge Summary (Signed)
[id number] discharge dictated stat

## 2011-07-17 NOTE — Progress Notes (Signed)
Subjective: Tolerated transfusion well.  Hr still 55-65, BP was up last night, given extra Clonidine. We had another family discussion about home care, which they were adamant about continuing to provide, but now are at the point where they think that she will be too much to handle.  Daughter and granddaughter spoke by phone today and decided that SNF would be best option realistically for her (as she will not qualify for inpatient rehab due to limited activity at baseline)  Had an NPO sign mistakenly on her door this AM, was removed, her day nurse had no idea why it was there.  Objective: Vital signs in last 24 hours: Temp:  [97.6 F (36.4 C)-98.6 F (37 C)] 98.4 F (36.9 C) (02/12 0650) Pulse Rate:  [18-72] 18  (02/12 0650) Resp:  [18-20] 20  (02/12 0650) BP: (140-194)/(68-88) 192/88 mmHg (02/12 0650) SpO2:  [92 %-98 %] 98 % (02/12 0650) Weight change:  Last BM Date: 07/14/11  Intake/Output from previous day: 02/11 0701 - 02/12 0700 In: 1350 [I.V.:650; Blood:700] Out: -  Intake/Output this shift:    General appearance: alert and appears stated age Resp: clear to auscultation bilaterally Cardio: regular rate and rhythm, S1, S2 normal, no murmur, click, rub or gallop GI: soft, non-tender; bowel sounds normal; no masses,  no organomegaly Extremities: extremities normal, atraumatic, no cyanosis or edema Skin: Skin color, texture, turgor normal. No rashes or lesions Neurologic: Alert and oriented X 3, normal strength and tone. Normal symmetric reflexes. Normal coordination and gait  Lab Results:  Basename 07/17/11 0600 07/16/11 0525  WBC 5.6 6.0  HGB 11.3* 7.9*  HCT 34.8* 25.3*  PLT 232 254   BMET  Basename 07/17/11 0600 07/16/11 0525  NA 140 141  K 4.6 4.3  CL 105 108  CO2 25 25  GLUCOSE 95 104*  BUN 18 22  CREATININE 0.92 1.16*  CALCIUM 10.5 10.4    Studies/Results: No results found.  Medications:  I have reviewed the patient's current medications. Scheduled:     . acetaminophen  650 mg Oral Once  . amLODipine  10 mg Oral Daily  . aspirin EC  81 mg Oral Daily  . cloNIDine  0.2 mg Oral Once  . cloNIDine  0.2 mg Oral TID  . fentaNYL  50 mcg Transdermal Q72H  . ferrous sulfate  325 mg Oral BID WC  . furosemide  40 mg Oral Daily  . isosorbide mononitrate  60 mg Oral Daily  . losartan  100 mg Oral Daily  . mulitivitamin with minerals  1 tablet Oral Daily  . olopatadine  1 drop Right Eye BID  . pantoprazole  40 mg Oral Q1200  . potassium chloride  20 mEq Oral Daily  . rosuvastatin  10 mg Oral q1800  . sodium chloride  3 mL Intravenous Q12H  . Travoprost (BAK Free)  1 drop Both Eyes QHS  . DISCONTD: amLODipine  5 mg Oral Daily  . DISCONTD: cloNIDine  0.2 mg Oral BID  . DISCONTD: enoxaparin  40 mg Subcutaneous Q24H   Continuous:   . sodium chloride 50 mL/hr at 07/17/11 0600   WUJ:[id number], acetaminophen, barrier cream  Assessment/Plan: Altered mental status- Seems close to baseline, but her daughter is realizing that this is not like she was years ago.  As a fairly new patient to my practice, I don't see a lot of difference today between now and interactions in the office. Mild hypercalcemia,-resolved, PTH 88, mild elevation.  WIll recheck at later date  as her calcium is normal with minimal IV fluids Coronary artery disease with history of MI, currently hemodynamically stable.  Bradycardia- D/Ced beta blocker this hospitalization. Will follow vitals. HTN- Increased Norvasc and added a 3rd clonidine dose today.   History congestive heart failure, see discussion above  Skin breakdown, wound care has provided info for family.  Barrier cream Dysphagia- Mild and needs a chopped diet, which is what family was providing at home.  Issues seem to be related to her dentures. Physical deconditioning,Saw PT, was up a bit with belt.  Will be hard for transfers by family.  They are seeing this realistically now and despite wishes to take her home, they  know that the bathing help they are currently getting is not enough for her to be cared for at home and they are starting to see the amount of work it will take on their own parts.  Looking at SNF. Anemia req x-fusion.  Now at 11 instead of upper 7's.  Not sure it has made a lot of clinical difference at this point.  Iron replacement daily. Dispo- Soc wk to expedite FL-2, fax to [phone] (number and instructions also given to nurse.  Will check at [facility] since family wants her in [place] and our MD's follow this facility.  If insurance covers and they have a female bed, possibly could go this afternoon.   LOS: 3 days   Tenya Araque W 07/17/2011, 8:01 AM

## 2011-07-17 NOTE — Discharge Summary (Signed)
[name], Brittany [name] NO.:  [id number]  MEDICAL RECORD NO.:  [id number]  LOCATION:  5522                         FACILITY:  [facility]  PHYSICIAN:  Gaspar Garbe, M.D.DATE OF BIRTH:  1911-05-26  DATE OF ADMISSION:  07/14/2011 DATE OF DISCHARGE:  07/17/2011                              DISCHARGE SUMMARY   MEDICATIONS ON DISCHARGE:  P.r.n. medicines: 1. Tylenol 650 mg every 6 hours p.r.n. for mild pain or fever. 2. Barrier cream to be applied to buttocks and under breasts twice     daily as needed.  Scheduled medicines: 1. Amlodipine 10 mg p.o. daily. 2. Aspirin 81 mg once daily. 3. Clonidine 0.2 mg 3 times daily. 4. Duragesic patch 50 mcg transdermal to be replaced every 72 hours.     She did have had a new patch placed prior to discharge on July 17, 2011. 5. Ferrous sulfate 325 mg twice daily. 6. Lasix 40 mg p.o. daily. 7. Isosorbide mononitrate 60 mg p.o. q.a.m. 8. Losartan 100 mg p.o. daily. 9. Multivitamin 1 tablet once daily. 10.Patanol 0.1% ophthalmic solution, 1 drop right eye twice daily. 11.Protonix 40 mg p.o. daily. 12.Potassium chloride 20 mEq p.o. daily. 13.Crestor 10 mg p.o. daily. 14.Travatan 0.004% ophthalmic solution, 1 drop both eyes daily at     bedtime.  FINAL DIAGNOSES: 1. Failure to thrive. 2. Altered mental status, secondary to delirium. 3. Anemia requiring transfusion. 4. Chronic degenerative joint disease. 5. Coronary artery disease. 6. Hypertension. 7. Hyperlipidemia. 8. Glaucoma. 9. Mild dysphagia, secondary to poorly fitting dentures.  LABORATORY TESTS:  On date of discharge, hemoglobin 11.3, which is improved from 7.9, status post 2 units of packed red blood cells.  BUN and creatinine are 18 and 0.9 respectively.  Her calcium was normal at 10.5, was initially in the middle 11 range.  The patient had a CT of her head which was nonacute, also had a chest x-ray performed on date of admission, which was July 14, 2011, showing no active disease and mild cardiomegaly again noted.  The patient underwent physical therapy evaluation, which indicates difficulty with transfers.  She will need at least 3 times daily physical therapy at facility.  The patient also underwent wound care evaluation with me for prevention of skin breakdown as was noted in the sacral area as well as buttocks as well as under breast as noted above.  PHYSICAL EXAMINATION:  VITAL SIGNS:  On date of discharge, blood pressure remains slightly elevated at 165/98, she had a recent increase in her blood pressure medications today.  Pulse is 72, respiratory rate 20, temp is 98.4, satting 98% on room air. GENERAL:  No acute distress. HEENT:  Normocephalic, atraumatic.  PERRLA.  EOMI.  ENT is within normal limits.  NECK:  Supple.  No lymphadenopathy, JVD, or bruit. HEART:  Regular rate and rhythm. LUNGS:  Clear to auscultation bilaterally. ABDOMEN:  Soft, nontender, normoactive bowel sounds. EXTREMITIES:  No clubbing, cyanosis, or edema are noted. SKIN:  Breakdown, which is grade 1 at her buttocks and slightly underneath her breast as well, a grade 1 with barrier cream appropriately applied. NEUROLOGIC:  The patient is oriented to person, place, and time.  She  had some difficulty relating to the circumstance, and some issues with waxing and waning recall, which are seemingly at their baseline today. She is generally weak and is able to stand at the edge of the bed.  Does not have any lateralizing signs but requires considerable physical therapy.  SUMMARY OF HOSPITAL COURSE:  [name] was admitted over the weekend with question of altered mental status.  She was found to be slightly dehydrated with a calcium in the mid 11 range, which resolved after days worth of IV fluids.  She had a PTH which was slightly elevated by 14 points at 86, whereas the cutoff is said to be 2 but has not had any recurrence of her elevated calcium.   She underwent CAT scan in the emergency room, which did not show any evidence of stroke.  Given her advanced age, this was not further worked up, and she had no other lateralizing signs and issues with mostly waxing and waning mental status.  I had a long discussion with her daughter, Brittany [name], who is also my patient.  I have known [name] for a very short period of time and has taken her on as primary care within the past year.  It was noted by Nursing on admission that the patient had several areas of decubitus breakdown.  The patient has a granddaughter who works as a Librarian, academic in [place] who has been providing a lot of guidance for [name].  They also have family and friends helping out as well as an agency coming in and doing general bathing and dressing for her for 2 hours a day.  We discussed these areas of breakdown and indicated that this may be difficult for her to return home.  The family was initially adamant that she do return home, but has finally decided that she is going to be too much work for them in order to handle and nursing home placement is pending at this time.  The patient has been in stable condition.  Has had several changes in her blood pressure medications.  She had her beta-blocker discontinued because of some bradycardia over the course of the weekend, which was asymptomatic.  Clonidine was increased as was her Norvasc prior to discharge, and her numbers are more within reasonable limits at this point.  FOLLOWUP:  The patient will need a repeat CBC and BMET in 1 week.  The patient had a transfusion due to probable anemia of chronic disease as her iron indices were normal, and her hemoglobin baseline should be in the 11s.  If this drops any more precipitously, she most likely will need further GI evaluation.  She is to have a dysphagia 2 diet.  She is to have physical therapy and wound care as noted above.  CODE STATUS:   The patient is do not resuscitate, do not intubate. Disposition was made after time in the hospital per family.  They had been mulling it over at her last outpatient visit and gold sheets were given to the family to pick up to bring to the facility from our office.     Gaspar Garbe, M.D.     RWT/MEDQ  D:  07/17/2011  T:  07/17/2011  Job:  [id number]

## 2011-07-17 NOTE — Progress Notes (Signed)
Speech Language/Pathology Speech Pathology: Dysphagia Treatment Note  Patient was observed with : Soft and Thin liquids.  Patient was noted to have s/s of aspiration : No:    Lung Sounds:  WNL Temperature: afebrile  Patient consuming Dys 2 finely chopped lunch upon arrival. No pocketing observed. Pt easily distracted by conversation. No s/s of aspiration with large sips of thin liquids. SLP provided further education to pt/family regarding esophageal precautions.   Clinical Impression: Pt tolerating diet with no s/s of aspiration. Pt and family are happy with softer consistency. All SLP needs are met, no f/u needed.   Recommendations:  Continue Dys 2/thin liquids. No SLP f/u needed.    Pain:   none Intervention Required:   No   Goals: All Goals Met

## 2011-07-17 NOTE — Progress Notes (Signed)
Physical Therapy Treatment Patient Details Name: Brittany [name] MRN: [id number] DOB: April 24, 1911 Today's Date: 07/17/2011  PT Assessment/Plan  PT - Assessment/Plan Comments on Treatment Session: Pt adm with AMS.  Pt's family feels they can't provide amount of care needed at home and want SNF. PT Plan: Discharge plan needs to be updated;Frequency needs to be updated PT Frequency: Min 2X/week Follow Up Recommendations: Skilled nursing facility Equipment Recommended: Defer to next venue PT Goals  Acute Rehab PT Goals PT Goal: Supine/Side to Sit - Progress: Progressing toward goal PT Goal: Sit to Stand - Progress: Progressing toward goal PT Goal: Stand to Sit - Progress: Progressing toward goal PT Transfer Goal: Bed to Chair/Chair to Bed - Progress: Progressing toward goal  PT Treatment Precautions/Restrictions  Precautions Precautions: Fall Restrictions Weight Bearing Restrictions: No Mobility (including Balance) Bed Mobility Supine to Sit: 3: Mod assist;HOB elevated (Comment degrees) (HOB at 20 degrees) Supine to Sit Details (indicate cue type and reason): verbal cues for technique, assist to raise trunk Sitting - Scoot to Edge of Bed: 4: Min assist Sitting - Scoot to Edge of Bed Details (indicate cue type and reason): incr time Transfers Sit to Stand: 1: +2 Total assist;Patient percentage (comment);With upper extremity assist;From bed;From chair/3-in-1 (pt = 50%) Sit to Stand Details (indicate cue type and reason): incr time.  Stood a 2nd time from Medical illustrator and used pad to assist hips up.  Stood with Stedy x 1 minute with min A once up. Stand to Sit: 1: +2 Total assist;Patient percentage (comment);With upper extremity assist;To chair/3-in-1;With armrests (cues for hand placement and assist to control descent) Stand Pivot Transfers: 1: +2 Total assist;Patient percentage (comment) (pt = 50%) Stand Pivot Transfer Details (indicate cue type and reason): Used RW.  Pt needed incr  time and difficulty moving RLE.    Exercise    End of Session PT - End of Session Equipment Utilized During Treatment: Gait belt Activity Tolerance: Patient tolerated treatment well Patient left: in chair;with call bell in reach;with family/visitor present Nurse Communication: Mobility status for transfers;Need for lift equipment (Recommend using Stedy for transfer back to bed) General Behavior During Session: [facility] for tasks performed Cognition: Impaired, at baseline  Brittany [name] 07/17/2011, 11:59 AM  [facility] PT [phone]

## 2012-10-15 ENCOUNTER — Other Ambulatory Visit: Payer: Self-pay | Admitting: Cardiovascular Disease

## 2012-10-15 ENCOUNTER — Telehealth: Payer: Self-pay | Admitting: Cardiovascular Disease

## 2012-10-15 NOTE — Telephone Encounter (Signed)
Patient is requesting that a copy of the labs be sent her pcp and Amy from [facility] labs wants to know if this ok? Please call back .

## 2012-10-16 LAB — PTH, INTACT AND CALCIUM: Calcium, Total (PTH): 9.8 mg/dL (ref 8.4–10.5)

## 2012-10-16 NOTE — Telephone Encounter (Signed)
Returned call.  Pt. Informed no ROI on file and she will need to sign that in order to release records.  Pt verbalized understanding and agreed w/ plan.

## 2012-11-10 ENCOUNTER — Telehealth: Payer: Self-pay | Admitting: Pharmacist Clinician (PhC)/ Clinical Pharmacy Specialist

## 2012-11-10 NOTE — Telephone Encounter (Signed)
Message copied by Rosalee Kaufman on Mon Nov 10, 2012  9:28 AM ------      Message from: Nicki Guadalajara A      Created: Fri Nov 07, 2012 11:57 AM       wnl ------

## 2012-11-26 NOTE — Progress Notes (Signed)
Quick Note:  Dr. Tresa Endo will discuss results at 12/02/12/appointment. ______

## 2012-12-11 ENCOUNTER — Encounter: Payer: Self-pay | Admitting: *Deleted

## 2012-12-11 ENCOUNTER — Encounter: Payer: Self-pay | Admitting: Cardiovascular Disease

## 2012-12-12 ENCOUNTER — Encounter: Payer: Self-pay | Admitting: Cardiovascular Disease

## 2012-12-12 ENCOUNTER — Ambulatory Visit (INDEPENDENT_AMBULATORY_CARE_PROVIDER_SITE_OTHER): Payer: Medicare Other | Admitting: Cardiovascular Disease

## 2012-12-12 VITALS — BP 120/62 | HR 55 | Ht 60.0 in

## 2012-12-12 DIAGNOSIS — I2510 Atherosclerotic heart disease of native coronary artery without angina pectoris: Secondary | ICD-10-CM

## 2012-12-12 DIAGNOSIS — I10 Essential (primary) hypertension: Secondary | ICD-10-CM

## 2012-12-12 DIAGNOSIS — I350 Nonrheumatic aortic (valve) stenosis: Secondary | ICD-10-CM | POA: Insufficient documentation

## 2012-12-12 DIAGNOSIS — I359 Nonrheumatic aortic valve disorder, unspecified: Secondary | ICD-10-CM

## 2012-12-12 DIAGNOSIS — E785 Hyperlipidemia, unspecified: Secondary | ICD-10-CM

## 2012-12-12 NOTE — Progress Notes (Signed)
Patient ID: Brittany [name], female   DOB: 1910/10/04, 79 y.o.   MRN: [id number]     HPI: Brittany [name], is a 79 y.o. female presents to the office for [facility] cardiology evaluation. Since I last saw her she had her 79 year old birthday. Brittany [name] suffered an acute coronary syndrome/VT/ VF cardiac arrest on 05/22/1998. At that time, I took her every week to the cardiac catheterization laboratory and performed successful PTCA to a totally occluded LAD at the bifurcation of the LAD and diagonal vessel. Initially had significant thrombus burden. At the time, I did not stent or a vessel since the lesion it in the region of a very large branch. In 2002 a repeat catheterization showed mild residual CAD for which we've been treating medically. Additional problems include hypertension, mild valvular aortic stenosis, hyperlipidemia, as well as intermittent leg swelling. In the past she was taken off beta blocker therapy due to bradycardia.  She not able to ambulate well but uses a wheelchair she denies chest pain. She denies shortness of breath. She denies significant increasing leg swelling.  Past Medical History  Diagnosis Date  . MI (myocardial infarction)   . Ventricular arrhythmia   . Hypertension   . CHF (congestive heart failure)   . Hyperlipemia   . GERD (gastroesophageal reflux disease)   . Mild aortic valve stenosis   . Bilateral edema of lower extremity     intermittent   . CAD (coronary artery disease)   . History of echocardiogram 03/22/2009    EF >55%; normal LV size & systolic function; normal RV size & systolic function; mild aortic stenosis; mild MR; mild TR  . History of nuclear stress test 12/23/2008    normal perfusion to all regions; negative for ischemia; low risk scan; Dipyridamole   . History of Doppler ultrasound 09/12/2010    carotid doppler; bilat blb/proximal ICAs demonstrated mild amount of fibrous plaque; mildly abnormal study     Past Surgical  History  Procedure Laterality Date  . Glaucoma surgery    . Cardiac catheterization  05/22/1998    PTCA to completely occluded LAD at bifurcation of LAD & diagonal vessel (r/t VT/VF arrest);   Marland Kitchen Cataract extraction  2008    with intraocular lens implantation    Allergies  Allergen Reactions  . Pineapple Swelling    Current Outpatient Prescriptions  Medication Sig Dispense Refill  . amLODipine (NORVASC) 10 MG tablet Take 10 mg by mouth daily.       Marland Kitchen aspirin EC 81 MG tablet Take 81 mg by mouth daily.      Marland Kitchen azelastine (OPTIVAR) 0.05 % ophthalmic solution Place 1 drop into the right eye 2 (two) times daily.      . barrier cream (NON-SPECIFIED) CREA Apply 1 application topically 2 (two) times daily as needed.      . fentaNYL (DURAGESIC - DOSED MCG/HR) 50 MCG/HR Place 1 patch onto the skin every 3 (three) days.      . fish oil-omega-3 fatty acids 1000 MG capsule Take 2 g by mouth daily.      . furosemide (LASIX) 40 MG tablet Take 40 mg by mouth daily.      . isosorbide mononitrate (IMDUR) 60 MG 24 hr tablet Take 60 mg by mouth daily.      . lansoprazole (PREVACID) 30 MG capsule Take 30 mg by mouth daily.      Marland Kitchen losartan (COZAAR) 50 MG tablet Take 25 mg by mouth daily.      Marland Kitchen  Multiple Vitamin (MULITIVITAMIN WITH MINERALS) TABS Take 1 tablet by mouth daily.      . potassium chloride 20 MEQ/15ML (10%) solution Take 20 mEq by mouth daily.       . rosuvastatin (CRESTOR) 10 MG tablet Take 10 mg by mouth daily.      . travoprost, benzalkonium, (TRAVATAN) 0.004 % ophthalmic solution Place 1 drop into both eyes at bedtime.      . cloNIDine (CATAPRES) 0.2 MG tablet Take 0.2 mg by mouth 2 (two) times daily.       No current facility-administered medications for this visit.    Socially she is widowed. She has 2 children, 5 grandchildren, and 4 great-grandchildren.  ROS is negative for fevers, chills or night sweats. She denies chest pain. She denies any significant mental changes. She denies PND  orthopnea. She denies difficulty with sleep. She denies bleeding. She does have a history of anemia. She denies any significant recent increase in leg swelling.  Other system review is negative.  PE BP 120/62  Pulse 55  Ht 5' (1.524 m)  General: Alert, oriented, no distress.  Skin: normal turgor, no rashes HEENT: Normocephalic, atraumatic. Pupils round and reactive; sclera anicteric;no lid lag.  Nose without nasal septal hypertrophy Mouth/Parynx benign; Mallinpatti scale 2 Neck: No JVD, no carotid briuts Lungs: clear to ausculatation and percussion; no wheezing or rales Heart: RRR, s1 s2 normal 1-2/6 systolic murmur in the aortic region compatible with her mild aortic stenosis Abdomen: soft, nontender; no hepatosplenomehaly, BS+; abdominal aorta nontender and not dilated by palpation. Pulses 2+ Extremities: no clubbing cyanosis or edema, Homan's sign negative  Neurologic: grossly nonfocal  ECG: Sinus rhythm at 55 beats per minute. PR 154 ms, QTc interval 411 milliseconds.  LABS:  BMET    Component Value Date/Time   NA 140 07/17/2011 0600   K 4.6 07/17/2011 0600   CL 105 07/17/2011 0600   CO2 25 07/17/2011 0600   GLUCOSE 95 07/17/2011 0600   BUN 18 07/17/2011 0600   CREATININE 0.92 07/17/2011 0600   CALCIUM 9.8 10/15/2012 1610   CALCIUM 10.5 07/17/2011 0600   GFRNONAA 50* 07/17/2011 0600   GFRAA 58* 07/17/2011 0600     Hepatic Function Panel     Component Value Date/Time   PROT 7.0 07/17/2011 0600   ALBUMIN 2.8* 07/17/2011 0600   AST 17 07/17/2011 0600   ALT 9 07/17/2011 0600   ALKPHOS 73 07/17/2011 0600   BILITOT 0.6 07/17/2011 0600   BILIDIR <0.1 07/14/2011 1341   IBILI NOT CALCULATED 07/14/2011 1341     CBC    Component Value Date/Time   WBC 5.6 07/17/2011 0600   RBC 3.90 07/17/2011 0600   HGB 11.3* 07/17/2011 0600   HCT 34.8* 07/17/2011 0600   PLT 232 07/17/2011 0600   MCV 89.2 07/17/2011 0600   MCH 29.0 07/17/2011 0600   MCHC 32.5 07/17/2011 0600   RDW 15.0 07/17/2011 0600    LYMPHSABS 1.4 07/14/2011 1227   MONOABS 0.4 07/14/2011 1227   EOSABS 0.1 07/14/2011 1227   BASOSABS 0.0 07/14/2011 1227     BNP No results found for this basename: probnp    Lipid Panel  No results found for this basename: chol, trig, hdl, cholhdl, vldl, ldlcalc     RADIOLOGY: No results found.    ASSESSMENT AND PLAN: Ms. Geil is now 80 years old. She is 15 years now status post her acute coronary syndrome and VT VF cardiac arrest which time she was successfully resuscitated  and taken emergently to the cardiac catheterization laboratory. She has continued to do remarkably well. Laboratory in March 2014 showed her to be mildly anemic with a hemoglobin of 10.9 and hematocrit of 33.5. Creatinine was 0.80. Cholesterol is 162 with an LDL cholesterol 59 HDL 74 triglycerides 143. She has been noted to be mildly hypercalcemic. She is followed by Dr. Antony Haste for primary care per cardiac standpoint she is continuing to do remarkably well. I'll see her in 6 months for followup evaluation.     Lennette Bihari, MD, [facility]  12/12/2012 8:21 PM

## 2012-12-12 NOTE — Patient Instructions (Signed)
Your physician recommends that you schedule a follow-up appointment in: 6 MONTHS. No changes has been made today. 

## 2013-07-06 ENCOUNTER — Encounter: Payer: Self-pay | Admitting: Cardiovascular Disease

## 2013-07-06 ENCOUNTER — Ambulatory Visit (INDEPENDENT_AMBULATORY_CARE_PROVIDER_SITE_OTHER): Payer: Medicare Other | Admitting: Cardiovascular Disease

## 2013-07-06 VITALS — BP 128/62 | HR 68 | Ht 60.0 in | Wt 129.1 lb

## 2013-07-06 DIAGNOSIS — I2510 Atherosclerotic heart disease of native coronary artery without angina pectoris: Secondary | ICD-10-CM

## 2013-07-06 DIAGNOSIS — I359 Nonrheumatic aortic valve disorder, unspecified: Secondary | ICD-10-CM

## 2013-07-06 DIAGNOSIS — I350 Nonrheumatic aortic (valve) stenosis: Secondary | ICD-10-CM

## 2013-07-06 DIAGNOSIS — I252 Old myocardial infarction: Secondary | ICD-10-CM

## 2013-07-06 DIAGNOSIS — Z79899 Other long term (current) drug therapy: Secondary | ICD-10-CM

## 2013-07-06 DIAGNOSIS — R5381 Other malaise: Secondary | ICD-10-CM

## 2013-07-06 DIAGNOSIS — I10 Essential (primary) hypertension: Secondary | ICD-10-CM

## 2013-07-06 DIAGNOSIS — E782 Mixed hyperlipidemia: Secondary | ICD-10-CM

## 2013-07-06 DIAGNOSIS — E785 Hyperlipidemia, unspecified: Secondary | ICD-10-CM

## 2013-07-06 DIAGNOSIS — R5383 Other fatigue: Secondary | ICD-10-CM

## 2013-07-06 NOTE — Progress Notes (Signed)
Patient ID: Brittany [name] Haefele, female   DOB: March 11, 1911, 80 y.o.   MRN: [id number]      HPI: Brittany [name] Arrazola, is a [age] y.o. female presents to the office for thcardiology evaluation.   Ms. Ericka PontiffMontgomery suffered an acute coronary syndrome complicated by VT/ VF cardiac arrest on 05/22/1997. At that time, I took her acutely to the cardiac catheterization laboratory and performed successful PTCA to a totally occluded LAD at the bifurcation of the LAD and diagonal vessel. Initially had significant thrombus burden. At the time, I did not stent her vessel since the lesion was in the region of a very large branch. In 2002 a repeat catheterization showed mild residual CAD for which we've been treating medically. Additional problems include hypertension, mild valvular aortic stenosis, hyperlipidemia, as well as intermittent leg swelling. In the past she was taken off beta blocker therapy due to bradycardia.  Since I last saw her, she has felt fairly stable without recurrent anginal symptoms. She denies significant shortness of breath. She not able to ambulate well but uses a wheelchair. She denies shortness of breath. She denies significant increasing leg swelling. She is unaware of palpitations. She denies presyncope or syncope.  Past Medical History  Diagnosis Date  . MI (myocardial infarction)   . Ventricular arrhythmia   . Hypertension   . CHF (congestive heart failure)   . Hyperlipemia   . GERD (gastroesophageal reflux disease)   . Mild aortic valve stenosis   . Bilateral edema of lower extremity     intermittent   . CAD (coronary artery disease)   . History of echocardiogram 03/22/2009    EF >55%; normal LV size & systolic function; normal RV size & systolic function; mild aortic stenosis; mild MR; mild TR  . History of nuclear stress test 12/23/2008    normal perfusion to all regions; negative for ischemia; low risk scan; Dipyridamole   . History of Doppler ultrasound 09/12/2010   carotid doppler; bilat blb/proximal ICAs demonstrated mild amount of fibrous plaque; mildly abnormal study     Past Surgical History  Procedure Laterality Date  . Glaucoma surgery    . Cardiac catheterization  05/22/1998    PTCA to completely occluded LAD at bifurcation of LAD & diagonal vessel (r/t VT/VF arrest);   Marland Kitchen. Cataract extraction  2008    with intraocular lens implantation    Allergies  Allergen Reactions  . Pineapple Swelling    Current Outpatient Prescriptions  Medication Sig Dispense Refill  . amLODipine (NORVASC) 10 MG tablet Take 10 mg by mouth daily.       Marland Kitchen. aspirin EC 81 MG tablet Take 81 mg by mouth daily.      Marland Kitchen. azelastine (OPTIVAR) 0.05 % ophthalmic solution Place 1 drop into the right eye 2 (two) times daily.      . barrier cream (NON-SPECIFIED) CREA Apply 1 application topically 2 (two) times daily as needed.      . cloNIDine (CATAPRES) 0.2 MG tablet Take 0.2 mg by mouth 2 (two) times daily.      . fentaNYL (DURAGESIC - DOSED MCG/HR) 50 MCG/HR Place 1 patch onto the skin every 3 (three) days.      . fish oil-omega-3 fatty acids 1000 MG capsule Take 2 g by mouth daily.      . furosemide (LASIX) 40 MG tablet Take 40 mg by mouth daily.      . isosorbide mononitrate (IMDUR) 60 MG 24 hr tablet Take 60 mg by mouth daily.      .Marland Kitchen  lansoprazole (PREVACID) 30 MG capsule Take 30 mg by mouth daily.      Marland Kitchen losartan (COZAAR) 50 MG tablet Take 25 mg by mouth 2 (two) times daily.       . Multiple Vitamin (MULITIVITAMIN WITH MINERALS) TABS Take 1 tablet by mouth daily.      . potassium chloride (K-DUR) 10 MEQ tablet Take 10 mEq by mouth daily.      . rosuvastatin (CRESTOR) 10 MG tablet Take 10 mg by mouth daily.      . travoprost, benzalkonium, (TRAVATAN) 0.004 % ophthalmic solution Place 1 drop into both eyes at bedtime.       No current facility-administered medications for this visit.    Socially she is widowed. She has 2 children, 5 grandchildren, and 4  great-grandchildren.  ROS is negative for fevers, chills or night sweats. He denies visual changes. She denies difficulty with hearing. There is no lymphadenopathy that she is aware of. She denies rashes. She denies presyncope or syncope. There is no wheezing. She denies PND or orthopnea. There is no chest pressure. There is mild shortness of breath with activity but it is difficult for her to walk without support. She denies nausea vomiting or diarrhea. She denies claudication. She does have musculoskeletal arthritis with significant osteoarthritis. There is a history of hyperlipidemia but she's been tolerating Crestor. At times she does note some leg swelling but this seems to done well with furosemide 40 mg. She is unaware of sleep disordered breathing.  She denies any significant mental changes. . She denies bleeding. She does have a history of anemia. Other comprehensive 14 point system review is negative.   PE BP 128/62  Pulse 68  Ht 5' (1.524 m)  Wt 129 lb 1.6 oz (58.559 kg)  BMI 25.21 kg/m2  General: Alert, oriented, no distress.  Skin: normal turgor, no rashes HEENT: Normocephalic, atraumatic. Pupils round and reactive; sclera anicteric;no lid lag. No xanthelasmas Nose without nasal septal hypertrophy  Mouth/Parynx benign; Mallinpatti scale 2 Neck: No JVD, no carotid bruits fairly normal carotid upstroke. Lungs: clear to ausculatation and percussion; no wheezing or rales Chest wall: Nontender to Heart: RRR, s1 s2 normal 1-2/6 systolic murmur in the aortic region compatible with her mild aortic stenosis Abdomen: soft, nontender; no hepatosplenomehaly, BS+; abdominal aorta nontender and not dilated by palpation. Back: No CVA tenderness Pulses 2+ Extremities: Significant arthritic changes of her hands; mild ulnar drift. no clubbing cyanosis or edema, Homan's sign negative  Neurologic: grossly nonfocal; cranial nerves grossly normal Psychologic: Normal cognition. Normal affect and mood  appear  ECG (independently read by me): Sinus rhythm at 68 beats per minute. LVH by voltage criteria. Normal intervals per  Prior ECG of 12/12/2012: Sinus rhythm at 55 beats per minute. PR 154 ms, QTc interval 411 milliseconds.  LABS:  BMET    Component Value Date/Time   NA 140 07/17/2011 0600   K 4.6 07/17/2011 0600   CL 105 07/17/2011 0600   CO2 25 07/17/2011 0600   GLUCOSE 95 07/17/2011 0600   BUN 18 07/17/2011 0600   CREATININE 0.92 07/17/2011 0600   CALCIUM 9.8 10/15/2012 1610   CALCIUM 10.5 07/17/2011 0600   GFRNONAA 50* 07/17/2011 0600   GFRAA 58* 07/17/2011 0600     Hepatic Function Panel     Component Value Date/Time   PROT 7.0 07/17/2011 0600   ALBUMIN 2.8* 07/17/2011 0600   AST 17 07/17/2011 0600   ALT 9 07/17/2011 0600   ALKPHOS 73 07/17/2011 0600  BILITOT 0.6 07/17/2011 0600   BILIDIR <0.1 07/14/2011 1341   IBILI NOT CALCULATED 07/14/2011 1341     CBC    Component Value Date/Time   WBC 5.6 07/17/2011 0600   RBC 3.90 07/17/2011 0600   HGB 11.3* 07/17/2011 0600   HCT 34.8* 07/17/2011 0600   PLT 232 07/17/2011 0600   MCV 89.2 07/17/2011 0600   MCH 29.0 07/17/2011 0600   MCHC 32.5 07/17/2011 0600   RDW 15.0 07/17/2011 0600   LYMPHSABS 1.4 07/14/2011 1227   MONOABS 0.4 07/14/2011 1227   EOSABS 0.1 07/14/2011 1227   BASOSABS 0.0 07/14/2011 1227     BNP No results found for this basename: probnp    Lipid Panel  No results found for this basename: chol,  trig,  hdl,  cholhdl,  vldl,  ldlcalc     RADIOLOGY: No results found.    ASSESSMENT AND PLAN: Ms. Carachure is now 80 years old and will be turning 89 on 11/26/2012. She is 16 years now status post her acute coronary syndrome and VT VF cardiac arrest which time she was successfully resuscitated and taken emergently to the cardiac catheterization laboratory. She has continued to do remarkably well. Laboratory in March 2014 showed her to be mildly anemic with a hemoglobin of 10.9 and hematocrit of 33.5. Creatinine was 0.80.  Cholesterol is 162 with an LDL cholesterol 59 HDL 74 triglycerides 143. She has been noted to be mildly hypercalcemic. She is followed by Dr.Tisovic for primary care. She tells me she now is on oxygen nocturnally to sleep.  I am recommending repeat laboratory be checked in the fasting state consisting of a CBC, CMP, TSH, lipid panel. I will contact her regarding results and adjustments will be made in her medical regimen if necessary. She is doing well with control of blood pressure on Cozaar 25 mg twice a day, furosemide 40 mg daily, Catapres 0.2 mg and amlodipine 10 mg. She's not having significant edema and is doing well with Lasix. She's not having anginal symptoms. Who recheck her lipids and she is tolerating Crestor. I will see her in 6 months for followup evaluation. She's not having anginal symptoms.    Lennette Bihari, MD, [facility]  07/06/2013 6:23 PM

## 2013-07-06 NOTE — Patient Instructions (Signed)
Your physician recommends that you schedule a follow-up appointment in: 6 months  Your physician recommends that you return for lab work CMP,CBC,TSH,FASTING LIPIDS

## 2014-03-01 ENCOUNTER — Encounter: Payer: Self-pay | Admitting: Cardiovascular Disease

## 2014-03-04 ENCOUNTER — Ambulatory Visit (INDEPENDENT_AMBULATORY_CARE_PROVIDER_SITE_OTHER): Payer: Medicare Other | Admitting: Cardiovascular Disease

## 2014-03-04 ENCOUNTER — Encounter: Payer: Self-pay | Admitting: Cardiovascular Disease

## 2014-03-04 VITALS — BP 138/70 | HR 73 | Ht 60.0 in | Wt 126.5 lb

## 2014-03-04 DIAGNOSIS — I2510 Atherosclerotic heart disease of native coronary artery without angina pectoris: Secondary | ICD-10-CM

## 2014-03-04 DIAGNOSIS — I252 Old myocardial infarction: Secondary | ICD-10-CM

## 2014-03-04 DIAGNOSIS — E785 Hyperlipidemia, unspecified: Secondary | ICD-10-CM

## 2014-03-04 DIAGNOSIS — I10 Essential (primary) hypertension: Secondary | ICD-10-CM

## 2014-03-04 DIAGNOSIS — I350 Nonrheumatic aortic (valve) stenosis: Secondary | ICD-10-CM

## 2014-03-04 NOTE — Patient Instructions (Signed)
Your physician wants you to follow-up in: 6 months or sooner if needed.You will receive a reminder letter in the mail two months in advance. If you don't receive a letter, please call our office to schedule the follow-up appointment. No changes were made today in your therapy. 

## 2014-03-04 NOTE — Progress Notes (Signed)
Patient ID: Brittany [name], female   DOB: 21-Apr-1911, 80 y.o.   MRN: [id number]      HPI: Brittany [name] is a 80 y.o. female presents to the office for an 8 month cardiology evaluation.   Brittany [name] suffered an acute coronary syndrome complicated by VT/ VF cardiac arrest on 05/22/1997. At that time she was 80 years old.  I took her acutely to the cardiac catheterization laboratory and performed successful PTCA to a totally occluded LAD at the bifurcation of the LAD and diagonal vessel. Initially had significant thrombus burden. At the time, I did not stent her vessel since the lesion was in the region of a very large branch. In 2002 a repeat catheterization showed mild residual CAD for which we've been treating medically. Additional problems include hypertension, mild valvular aortic stenosis, hyperlipidemia, as well as intermittent leg swelling. In the past she was taken off beta blocker therapy due to bradycardia.  Since I last saw her, she has felt fairly stable without recurrent anginal symptoms.  She does complain of some mild arthritic symptoms.  She denies significant shortness of breath. She is not able to ambulate well but uses a wheelchair. She denies shortness of breath. She denies significant increasing leg swelling. She is unaware of palpitations. She denies presyncope or syncope.  She does have a history of anemia and has been on iron therapy followed by Dr. Elio Forget.  She has been on Crestor 10 mg for hyperlipidemia.  Her most recent laboratory done this week revealed a BUN of 21 and creatinine 0.8 with a fasting blood sugar of 88.  Past Medical History  Diagnosis Date  . MI (myocardial infarction)   . Ventricular arrhythmia   . Hypertension   . CHF (congestive heart failure)   . Hyperlipemia   . GERD (gastroesophageal reflux disease)   . Mild aortic valve stenosis   . Bilateral edema of lower extremity     intermittent   . CAD (coronary artery disease)   .  History of echocardiogram 03/22/2009    EF >55%; normal LV size & systolic function; normal RV size & systolic function; mild aortic stenosis; mild MR; mild TR  . History of nuclear stress test 12/23/2008    normal perfusion to all regions; negative for ischemia; low risk scan; Dipyridamole   . History of Doppler ultrasound 09/12/2010    carotid doppler; bilat blb/proximal ICAs demonstrated mild amount of fibrous plaque; mildly abnormal study     Past Surgical History  Procedure Laterality Date  . Glaucoma surgery    . Cardiac catheterization  05/22/1998    PTCA to completely occluded LAD at bifurcation of LAD & diagonal vessel (r/t VT/VF arrest);   Marland Kitchen Cataract extraction  2008    with intraocular lens implantation    Allergies  Allergen Reactions  . Pineapple Swelling    Current Outpatient Prescriptions  Medication Sig Dispense Refill  . amLODipine (NORVASC) 10 MG tablet Take 10 mg by mouth daily.       Marland Kitchen aspirin EC 81 MG tablet Take 81 mg by mouth daily.      . cloNIDine (CATAPRES) 0.2 MG tablet Take 0.2 mg by mouth 2 (two) times daily.      . fentaNYL (DURAGESIC - DOSED MCG/HR) 50 MCG/HR Place 1 patch onto the skin every 3 (three) days.      . Ferrous Sulfate (IRON) 325 (65 FE) MG TABS Take 1 tablet by mouth daily.      . furosemide (  LASIX) 40 MG tablet Take 40 mg by mouth daily.      . isosorbide mononitrate (IMDUR) 60 MG 24 hr tablet Take 60 mg by mouth daily.      Marland Kitchen KRILL OIL OMEGA-3 PO Take 1 capsule by mouth daily.      . lansoprazole (PREVACID) 30 MG capsule Take 30 mg by mouth daily.      Marland Kitchen losartan (COZAAR) 50 MG tablet Take 25 mg by mouth 2 (two) times daily.       . Multiple Vitamin (MULITIVITAMIN WITH MINERALS) TABS Take 1 tablet by mouth daily.      Marland Kitchen olopatadine (PATANOL) 0.1 % ophthalmic solution Place 1 drop into the right eye daily.      . potassium chloride (K-DUR) 10 MEQ tablet Take 10 mEq by mouth daily.      . rosuvastatin (CRESTOR) 10 MG tablet Take 10 mg by  mouth daily.      . travoprost, benzalkonium, (TRAVATAN) 0.004 % ophthalmic solution Place 1 drop into both eyes at bedtime.       No current facility-administered medications for this visit.    Socially she is widowed. She has 2 children, 5 grandchildren, and 4 great-grandchildren.  ROS General: Negative; No fevers, chills, or night sweats;  HEENT: Negative; No changes in vision or hearing, sinus congestion, difficulty swallowing Pulmonary: Negative; No cough, wheezing, shortness of breath, hemoptysis Cardiovascular: Negative; No chest pain, presyncope, syncope, palpitations GI: Negative; No nausea, vomiting, diarrhea, or abdominal pain GU: Negative; No dysuria, hematuria, or difficulty voiding Musculoskeletal: Arthritic symptoms; no myalgias, joint pain, or weakness Hematologic/Oncology: Positive for anemia; no easy bruising, bleeding Endocrine: Negative; no heat/cold intolerance; no diabetes Neuro: Negative; no changes in balance, headaches Skin: Negative; No rashes or skin lesions Psychiatric: Negative; No behavioral problems, depression Sleep: Negative; No snoring, daytime sleepiness, hypersomnolence, bruxism, restless legs, hypnogognic hallucinations, no cataplexy Other comprehensive 14 point system review is negative.    PE BP 138/70  Pulse 73  Ht 5' (1.524 m)  Wt 126 lb 8 oz (57.38 kg)  BMI 24.71 kg/m2  General: Alert, oriented, no distress.  Skin: normal turgor, no rashes HEENT: Normocephalic, atraumatic. Pupils round and reactive; sclera anicteric;no lid lag. No xanthelasmas Nose without nasal septal hypertrophy  Mouth/Parynx benign; Mallinpatti scale 2 Neck: No JVD, no carotid bruits fairly normal carotid upstroke. Lungs: clear to ausculatation and percussion; no wheezing or rales Chest wall: Nontender to Heart: RRR, s1 s2 normal 1-2/6 systolic murmur in the aortic region compatible with her mild aortic stenosis Abdomen: soft, nontender; no hepatosplenomehaly, BS+;  abdominal aorta nontender and not dilated by palpation. Back: No CVA tenderness Pulses 2+ Extremities: Significant arthritic changes of her hands; mild ulnar drift, left hand greater than right; no clubbing cyanosis or edema, Homan's sign negative  Neurologic: grossly nonfocal; cranial nerves grossly normal Psychologic: Normal cognition. Normal affect and mood   ECG (independently read by me): Normal sinus rhythm at 73 beats per minute.  LVH with repolarization changes.  Prior February 2015 ECG (independently read by me): Sinus rhythm at 68 beats per minute. LVH by voltage criteria. Normal intervals   Prior ECG of 12/12/2012: Sinus rhythm at 55 beats per minute. PR 154 ms, QTc interval 411 milliseconds.  LABS:  BMET    Component Value Date/Time   NA 140 07/17/2011 0600   K 4.6 07/17/2011 0600   CL 105 07/17/2011 0600   CO2 25 07/17/2011 0600   GLUCOSE 95 07/17/2011 0600   BUN 18  07/17/2011 0600   CREATININE 0.92 07/17/2011 0600   CALCIUM 9.8 10/15/2012 1610   CALCIUM 10.5 07/17/2011 0600   GFRNONAA 50* 07/17/2011 0600   GFRAA 58* 07/17/2011 0600     Hepatic Function Panel     Component Value Date/Time   PROT 7.0 07/17/2011 0600   ALBUMIN 2.8* 07/17/2011 0600   AST 17 07/17/2011 0600   ALT 9 07/17/2011 0600   ALKPHOS 73 07/17/2011 0600   BILITOT 0.6 07/17/2011 0600   BILIDIR <0.1 07/14/2011 1341   IBILI NOT CALCULATED 07/14/2011 1341     CBC    Component Value Date/Time   WBC 5.6 07/17/2011 0600   RBC 3.90 07/17/2011 0600   HGB 11.3* 07/17/2011 0600   HCT 34.8* 07/17/2011 0600   PLT 232 07/17/2011 0600   MCV 89.2 07/17/2011 0600   MCH 29.0 07/17/2011 0600   MCHC 32.5 07/17/2011 0600   RDW 15.0 07/17/2011 0600   LYMPHSABS 1.4 07/14/2011 1227   MONOABS 0.4 07/14/2011 1227   EOSABS 0.1 07/14/2011 1227   BASOSABS 0.0 07/14/2011 1227     BNP No results found for this basename: probnp    Lipid Panel  No results found for this basename: chol,  trig,  hdl,  cholhdl,  vldl,  ldlcalc      RADIOLOGY: No results found.    ASSESSMENT AND PLAN: Ms. Ericka PontiffMontgomery is now 80 years old and is almost 17 years since presenting with her acute coronary syndrome  VT/ VF cardiac arrest at which time she was successfully resuscitated and taken emergently to the cardiac catheterization laboratory. She has continued to do remarkably well.  I did review recent blood work by Dr. Elio ForgetPacific.  She continues to be anemic with a hemoglobin of 9.5, hematocrit of 28.1.  She is back on iron therapy.  Her blood pressure today is excellent.  On her current medical regimen consisting of amlodipine 10 mg, clonidine, 0.2 mg twice a day, furosemide, 40 mg, losartan 25 mg twice a day i addition to her isosorbide.  She's not having any anginal symptoms.  She does have mild aortic stenosis contributing to her cardiac murmur. She is on Crestor 10 mg for hyperlipidemia.  Her liver function studies are normal.  She does have arthritic symptoms.  She will be undergoing physical therapy.  Her heart rhythm remained stable.  She'll continue current therapy as described.  I will see her in 6 months for reevaluation or sooner if needed.   Lennette Biharihomas A. Jailon Schaible, MD, [facility]  03/04/2014 11:52 AM

## 2014-09-03 ENCOUNTER — Ambulatory Visit (INDEPENDENT_AMBULATORY_CARE_PROVIDER_SITE_OTHER): Payer: Medicare Other | Admitting: Cardiovascular Disease

## 2014-09-03 VITALS — BP 117/62 | HR 64 | Ht 60.0 in | Wt 128.9 lb

## 2014-09-03 DIAGNOSIS — I350 Nonrheumatic aortic (valve) stenosis: Secondary | ICD-10-CM | POA: Diagnosis not present

## 2014-09-03 DIAGNOSIS — I10 Essential (primary) hypertension: Secondary | ICD-10-CM | POA: Diagnosis not present

## 2014-09-03 DIAGNOSIS — I252 Old myocardial infarction: Secondary | ICD-10-CM

## 2014-09-03 DIAGNOSIS — E785 Hyperlipidemia, unspecified: Secondary | ICD-10-CM

## 2014-09-03 DIAGNOSIS — I2510 Atherosclerotic heart disease of native coronary artery without angina pectoris: Secondary | ICD-10-CM

## 2014-09-03 NOTE — Patient Instructions (Signed)
Your physician wants you to follow-up in: 6 months or sooner if needed with Dr. Kelly. You will receive a reminder letter in the mail two months in advance. If you don't receive a letter, please call our office to schedule the follow-up appointment. 

## 2014-09-04 ENCOUNTER — Encounter: Payer: Self-pay | Admitting: Cardiovascular Disease

## 2014-09-04 NOTE — Progress Notes (Signed)
Patient ID: Brittany [name], female   DOB: February 23, 1911, 81 y.o.   MRN: [id number]       Primary M.D.: [name]  HPI: Brittany [name] is a 81 y.o. female presents to the office for a 6 month cardiology evaluation.   Brittany [name] suffered an acute coronary syndrome complicated by VT/ VF cardiac arrest on 05/22/1997. At that time she was 81 years old.  I took her acutely to the cardiac catheterization laboratory and performed successful PTCA to a totally occluded LAD at the bifurcation of the LAD and diagonal vessel. Initially had significant thrombus burden. At the time, I did not stent her vessel since the lesion was in the region of a very large branch. In 2002 a repeat catheterization showed mild residual CAD  which has been treated with medical therapy.   Additional problems include hypertension, mild valvular aortic stenosis, hyperlipidemia, as well as intermittent leg swelling. In the past she was taken off beta blocker therapy due to bradycardia.  She also has a history of anemia.  Since I last saw her, she has felt fairly stable without recurrent anginal symptoms.  She does complain of some mild arthritic symptoms.  She denies significant shortness of breath. She is not able to ambulate well but uses a wheelchair.  She occasionally walks with a walker.  She has had difficulty with arthritis . She denies shortness of breath.  She denies PND, orthopnea She denies significant increasing leg swelling. She is unaware of palpitations. She denies presyncope or syncope.   Past Medical History  Diagnosis Date  . MI (myocardial infarction)   . Ventricular arrhythmia   . Hypertension   . CHF (congestive heart failure)   . Hyperlipemia   . GERD (gastroesophageal reflux disease)   . Mild aortic valve stenosis   . Bilateral edema of lower extremity     intermittent   . CAD (coronary artery disease)   . History of echocardiogram 03/22/2009    EF >55%; normal LV size & systolic  function; normal RV size & systolic function; mild aortic stenosis; mild MR; mild TR  . History of nuclear stress test 12/23/2008    normal perfusion to all regions; negative for ischemia; low risk scan; Dipyridamole   . History of Doppler ultrasound 09/12/2010    carotid doppler; bilat blb/proximal ICAs demonstrated mild amount of fibrous plaque; mildly abnormal study     Past Surgical History  Procedure Laterality Date  . Glaucoma surgery    . Cardiac catheterization  05/22/1998    PTCA to completely occluded LAD at bifurcation of LAD & diagonal vessel (r/t VT/VF arrest);   Marland Kitchen Cataract extraction  2008    with intraocular lens implantation    Allergies  Allergen Reactions  . Pineapple Swelling    Current Outpatient Prescriptions  Medication Sig Dispense Refill  . amLODipine (NORVASC) 10 MG tablet Take 10 mg by mouth daily.     Marland Kitchen aspirin EC 81 MG tablet Take 81 mg by mouth daily.    . cloNIDine (CATAPRES) 0.2 MG tablet Take 0.2 mg by mouth 2 (two) times daily.    . fentaNYL (DURAGESIC - DOSED MCG/HR) 50 MCG/HR Place 1 patch onto the skin every 3 (three) days.    . Ferrous Sulfate (IRON) 325 (65 FE) MG TABS Take 1 tablet by mouth daily.    . furosemide (LASIX) 40 MG tablet Take 40 mg by mouth daily.    . isosorbide mononitrate (IMDUR) 60 MG 24 hr tablet  Take 60 mg by mouth daily.    Marland Kitchen KRILL OIL OMEGA-3 PO Take 1 capsule by mouth daily.    . lansoprazole (PREVACID) 30 MG capsule Take 30 mg by mouth daily.    Marland Kitchen losartan (COZAAR) 50 MG tablet Take 25 mg by mouth 2 (two) times daily.     . Multiple Vitamin (MULITIVITAMIN WITH MINERALS) TABS Take 1 tablet by mouth daily.    Marland Kitchen olopatadine (PATANOL) 0.1 % ophthalmic solution Place 1 drop into the right eye daily.    . potassium chloride (K-DUR) 10 MEQ tablet Take 10 mEq by mouth daily.    . rosuvastatin (CRESTOR) 10 MG tablet Take 10 mg by mouth daily.    . travoprost, benzalkonium, (TRAVATAN) 0.004 % ophthalmic solution Place 1 drop into  both eyes at bedtime.     No current facility-administered medications for this visit.    Socially she is widowed. She has 2 children, 5 grandchildren, and 4 great-grandchildren.  ROS General: Negative; No fevers, chills, or night sweats;  HEENT: Negative; No changes in vision or hearing, sinus congestion, difficulty swallowing Pulmonary: Negative; No cough, wheezing, shortness of breath, hemoptysis Cardiovascular: Negative; No chest pain, presyncope, syncope, palpitations GI: Negative; No nausea, vomiting, diarrhea, or abdominal pain GU: Negative; No dysuria, hematuria, or difficulty voiding Musculoskeletal: Arthritic symptoms; no myalgias, joint pain, or weakness Hematologic/Oncology: Positive for anemia; no easy bruising, bleeding Endocrine: Negative; no heat/cold intolerance; no diabetes Neuro: Negative; no changes in balance, headaches Skin: Negative; No rashes or skin lesions Psychiatric: Negative; No behavioral problems, depression Sleep: Negative; No snoring, daytime sleepiness, hypersomnolence, bruxism, restless legs, hypnogognic hallucinations, no cataplexy Other comprehensive 14 point system review is negative.    PE BP 117/62 mmHg  Pulse 64  Ht 5' (1.524 m)  Wt 128 lb 14.4 oz (58.469 kg)  BMI 25.17 kg/m2  General: Alert, oriented, no distress.  Skin: normal turgor, no rashes HEENT: Normocephalic, atraumatic. Pupils round and reactive; sclera anicteric;no lid lag. No xanthelasmas Nose without nasal septal hypertrophy  Mouth/Parynx benign; Mallinpatti scale 2 Neck: No JVD, no carotid bruits fairly normal carotid upstroke. Lungs: clear to ausculatation and percussion; no wheezing or rales Chest wall: Nontender to Heart: RRR, s1 s2 normal 3-8/2 systolic murmur in the aortic region compatible with her mild aortic stenosis Abdomen: soft, nontender; no hepatosplenomehaly, BS+; abdominal aorta nontender and not dilated by palpation. Back: No CVA tenderness Pulses  2+ Extremities: Significant arthritic changes of her hands; mild ulnar drift, left hand greater than right; no clubbing cyanosis or edema, Homan's sign negative  Neurologic: grossly nonfocal; cranial nerves grossly normal Psychologic: Normal cognition. Normal affect and mood   ECG (independently read by me): Normal sinus rhythm at 64 bpm.  LVH with repolarization changes in lead aVL.  No additionalsignificant ST segment abnormalities  03/04/2014 ECG (independently read by me): Normal sinus rhythm at 73 beats per minute.  LVH with repolarization changes.  Prior February 2015 ECG (independently read by me): Sinus rhythm at 68 beats per minute. LVH by voltage criteria. Normal intervals   Prior ECG of 12/12/2012: Sinus rhythm at 55 beats per minute. PR 154 ms, QTc interval 411 milliseconds.  LABS:  BMET  BMP Latest Ref Rng 10/15/2012 07/17/2011 07/16/2011  Glucose 70 - 99 mg/dL - 95 104(H)  BUN 6 - 23 mg/dL - 18 22  Creatinine 0.50 - 1.10 mg/dL - 0.92 1.16(H)  Sodium 135 - 145 mEq/L - 140 141  Potassium 3.5 - 5.1 mEq/L - 4.6 4.3  Chloride 96 - 112 mEq/L - 105 108  CO2 19 - 32 mEq/L - 25 25  Calcium 8.4 - 10.5 mg/dL 9.8 10.5 10.4     Hepatic Function Panel   Hepatic Function Latest Ref Rng 07/17/2011 07/16/2011 07/15/2011  Total Protein 6.0 - 8.3 g/dL 7.0 6.6 6.1  Albumin 3.5 - 5.2 g/dL 2.8(L) 2.6(L) 2.5(L)  AST 0 - 37 U/L 17 17 14   ALT 0 - 35 U/L 9 8 5   Alk Phosphatase 39 - 117 U/L 73 65 59  Total Bilirubin 0.3 - 1.2 mg/dL 0.6 0.2(L) 0.2(L)  Bilirubin, Direct 0.0 - 0.3 mg/dL - - -     CBC  CBC Latest Ref Rng 07/17/2011 07/16/2011 07/15/2011  WBC 4.0 - 10.5 K/uL 5.6 6.0 4.9  Hemoglobin 12.0 - 15.0 g/dL 11.3(L) 7.9(L) 7.9(L)  Hematocrit 36.0 - 46.0 % 34.8(L) 25.3(L) 25.2(L)  Platelets 150 - 400 K/uL 232 254 231     BNP No results found for: PROBNP  Lipid Panel  No results found for: CHOL   RADIOLOGY: No results found.    ASSESSMENT AND PLAN: Ms. Hulick is a 81  year old African-American female who suffered a VT/VF cardiac arrest on 05/22/1997 at age 4.  She was successfully resuscitated and taken emergently to the cardiac catheterization laboratory by me. She has continued to do remarkably well.  Her physical exam reveals mild aortic stenosis murmur.  She has had issues with anemia and has required iron therapy by [name].  She has had issues with arthritis.  She denies anginal symptoms on her current dose of isosorbide 60 mg, amlodipine 10 mg and in addition to this therapy her blood pressure is controlled with Lasix 40 mg daily, clonidine 0.2 mg twice a day, and losartan 25 mg twice a day.  She does not have significant edema.  She has mild aortic valve stenosis.  There is no presyncope or syncope.  She continues to tolerate Crestor 10 mg daily in addition to baby aspirin.  [name] will be obtaining follow-up laboratory.  I will ask that these be sent to my office for my review.  She continues to do remarkably well and will be turning 81 years old in 2 months.  As long as she remains stable, I will see her in 6 months for reevaluation.   Troy Sine, MD, [facility]  09/04/2014 2:48 PM

## 2014-10-16 ENCOUNTER — Emergency Department (INDEPENDENT_AMBULATORY_CARE_PROVIDER_SITE_OTHER)
Admission: EM | Admit: 2014-10-16 | Discharge: 2014-10-16 | Disposition: A | Discharge status: Home / Self Care | Payer: Medicare Other | Source: Home / Self Care | Attending: Family Medicine | Admitting: Family Medicine

## 2014-10-16 ENCOUNTER — Encounter (HOSPITAL_COMMUNITY): Payer: Self-pay | Admitting: Family Medicine

## 2014-10-16 DIAGNOSIS — S6000XA Contusion of unspecified finger without damage to nail, initial encounter: Secondary | ICD-10-CM | POA: Diagnosis not present

## 2014-10-16 NOTE — Discharge Instructions (Signed)
You have bruised your finger. There is no evidence of infection, broken bone, or gout. You can apply warm compresses or soak your finger in warm water to help

## 2014-10-16 NOTE — ED Provider Notes (Signed)
CSN: [id number]     Arrival date & time 10/16/14  1739 History   First MD Initiated Contact with Patient 10/16/14 1742     No chief complaint on file.  (Consider location/radiation/quality/duration/timing/severity/associated sxs/prior Treatment) HPI   R ring finger. Became discolored 2 days ago. nontender and no swelling. Symptoms are constant and unchanged since onset. Family members removed rings out of concern for swelling. Denies trauma to the hand. Patient with severely arthritic bilateral hands. Denies fevers, shortness of breath, chest pain, shortness of breath, rash, abdominal pain, dysuria, frequency, lower extremity swelling above baseline.  Past Medical History  Diagnosis Date  . MI (myocardial infarction)   . Ventricular arrhythmia   . Hypertension   . CHF (congestive heart failure)   . Hyperlipemia   . GERD (gastroesophageal reflux disease)   . Mild aortic valve stenosis   . Bilateral edema of lower extremity     intermittent   . CAD (coronary artery disease)   . History of echocardiogram 03/22/2009    EF >55%; normal LV size & systolic function; normal RV size & systolic function; mild aortic stenosis; mild MR; mild TR  . History of nuclear stress test 12/23/2008    normal perfusion to all regions; negative for ischemia; low risk scan; Dipyridamole   . History of Doppler ultrasound 09/12/2010    carotid doppler; bilat blb/proximal ICAs demonstrated mild amount of fibrous plaque; mildly abnormal study    Past Surgical History  Procedure Laterality Date  . Glaucoma surgery    . Cardiac catheterization  05/22/1998    PTCA to completely occluded LAD at bifurcation of LAD & diagonal vessel (r/t VT/VF arrest);   Marland Kitchen. Cataract extraction  2008    with intraocular lens implantation   Family History  Problem Relation Age of Onset  . Stroke Mother   . Cancer Sister   . Heart attack Sister   . Cancer Child   . Diabetes Child    History  Substance Use Topics  . Smoking  status: Never Smoker   . Smokeless tobacco: Never Used  . Alcohol Use: No   OB History    No data available     Review of Systems Per HPI with all other pertinent systems negative.   Allergies  Pineapple  Home Medications   Prior to Admission medications   Medication Sig Start Date End Date Taking? Authorizing Provider  amLODipine (NORVASC) 10 MG tablet Take 10 mg by mouth daily.     Historical Provider, MD  aspirin EC 81 MG tablet Take 81 mg by mouth daily.    Historical Provider, MD  cloNIDine (CATAPRES) 0.2 MG tablet Take 0.2 mg by mouth 2 (two) times daily. 07/17/11   Rich Tisovec, MD  fentaNYL (DURAGESIC - DOSED MCG/HR) 50 MCG/HR Place 1 patch onto the skin every 3 (three) days.    Historical Provider, MD  Ferrous Sulfate (IRON) 325 (65 FE) MG TABS Take 1 tablet by mouth daily.    Historical Provider, MD  furosemide (LASIX) 40 MG tablet Take 40 mg by mouth daily.    Historical Provider, MD  isosorbide mononitrate (IMDUR) 60 MG 24 hr tablet Take 60 mg by mouth daily.    Historical Provider, MD  KRILL OIL OMEGA-3 PO Take 1 capsule by mouth daily.    Historical Provider, MD  lansoprazole (PREVACID) 30 MG capsule Take 30 mg by mouth daily.    Historical Provider, MD  losartan (COZAAR) 50 MG tablet Take 25 mg by mouth 2 (  two) times daily.     Historical Provider, MD  Multiple Vitamin (MULITIVITAMIN WITH MINERALS) TABS Take 1 tablet by mouth daily.    Historical Provider, MD  olopatadine (PATANOL) 0.1 % ophthalmic solution Place 1 drop into the right eye daily.    Historical Provider, MD  potassium chloride (K-DUR) 10 MEQ tablet Take 10 mEq by mouth daily.    Historical Provider, MD  rosuvastatin (CRESTOR) 10 MG tablet Take 10 mg by mouth daily.    Historical Provider, MD  travoprost, benzalkonium, (TRAVATAN) 0.004 % ophthalmic solution Place 1 drop into both eyes at bedtime.    Historical Provider, MD   BP 138/61 mmHg  Pulse 78  Temp(Src) 97.9 F (36.6 C) (Oral)  Resp 16  SpO2  98% Physical Exam Physical Exam  Constitutional: oriented to person, place, and time. appears well-developed and well-nourished. No distress.  HENT:  Head: Normocephalic and atraumatic.  Eyes: EOMI. PERRL.  Neck: Normal range of motion.  Cardiovascular: RRR, no m/r/g, 2+ distal pulses,  Pulmonary/Chest: Effort normal and breath sounds normal. No respiratory distress.  Abdominal: Soft. Bowel sounds are normal. NonTTP, no distension.  Musculoskeletal: Large ecchymoses of the right ring finger on the volar surface. No bony abnormality of thin fingers, nontender to palpation, no swelling, less than 2 second cap refill..  Neurological: alert and oriented to person, place, and time.  Skin: Skin is warm. No rash noted. non diaphoretic.  Psychiatric: normal mood and affect. behavior is normal. Judgment and thought content normal.   ED Course  Procedures (including critical care time) Labs Review Labs Reviewed - No data to display  Imaging Review No results found.   MDM   1. Finger contusion, initial encounter    Patient with likely very thin peripheral capillaries and on aspirin which caused a small hematoma after likely minimal subconscious trauma. No evidence of fracture, gout, infection. Continue normal daily activities. Weight to put on rings on until ecchymoses improves. Return precautions discussed.  Ozella Rocksavid J Merrell, MD 10/16/14 [phone]

## 2015-03-08 ENCOUNTER — Encounter: Payer: Self-pay | Admitting: Cardiovascular Disease

## 2015-03-08 ENCOUNTER — Ambulatory Visit (INDEPENDENT_AMBULATORY_CARE_PROVIDER_SITE_OTHER): Payer: Medicare Other | Admitting: Cardiovascular Disease

## 2015-03-08 VITALS — BP 104/56 | HR 61 | Ht 60.0 in | Wt 125.0 lb

## 2015-03-08 DIAGNOSIS — I350 Nonrheumatic aortic (valve) stenosis: Secondary | ICD-10-CM | POA: Diagnosis not present

## 2015-03-08 DIAGNOSIS — E785 Hyperlipidemia, unspecified: Secondary | ICD-10-CM | POA: Diagnosis not present

## 2015-03-08 DIAGNOSIS — I252 Old myocardial infarction: Secondary | ICD-10-CM

## 2015-03-08 DIAGNOSIS — I10 Essential (primary) hypertension: Secondary | ICD-10-CM

## 2015-03-08 DIAGNOSIS — I2510 Atherosclerotic heart disease of native coronary artery without angina pectoris: Secondary | ICD-10-CM

## 2015-03-08 NOTE — Progress Notes (Signed)
Patient ID: Brittany [name], female   DOB: 11-12-1910, 81 y.o.   MRN: [id number]     Primary M.D.: [name]  HPI: Brittany [name] is a 81 y.o. female presents to the office for a 6 month cardiology evaluation.   Brittany [name] suffered an acute coronary syndrome complicated by VT/ VF cardiac arrest on 05/22/1997. At that time she was 81 years old.  I took her acutely to the cardiac catheterization laboratory and performed successful PTCA to a totally occluded LAD at the bifurcation of the LAD and diagonal vessel. Initially had significant thrombus burden. At the time, I did not stent her vessel since the lesion was in the region of a very large branch. In 2002 a repeat catheterization showed mild residual CAD  which has been treated with medical therapy.  She has a history of hypertension, mild valvular aortic stenosis, hyperlipidemia, as well as intermittent leg swelling. In the past she was taken off beta blocker therapy due to bradycardia.  She also has a history of anemia.  Since I last saw her, she has continued to do well without recurrent anginal symptoms.  She does complain of some mild arthritic symptoms.  She denies significant shortness of breath. She is not able to ambulate well but uses a wheelchair.  She occasionally walks with a walker.  She has had difficulty with arthritis . She denies shortness of breath.  She denies PND, orthopnea She denies significant increasing leg swelling. She is unaware of palpitations. She denies presyncope or syncope.   Past Medical History  Diagnosis Date  . MI (myocardial infarction) ([place])   . Ventricular arrhythmia   . Hypertension   . CHF (congestive heart failure) ([place])   . Hyperlipemia   . GERD (gastroesophageal reflux disease)   . Mild aortic valve stenosis   . Bilateral edema of lower extremity     intermittent   . CAD (coronary artery disease)   . History of echocardiogram 03/22/2009    EF >55%; normal LV size & systolic  function; normal RV size & systolic function; mild aortic stenosis; mild MR; mild TR  . History of nuclear stress test 12/23/2008    normal perfusion to all regions; negative for ischemia; low risk scan; Dipyridamole   . History of Doppler ultrasound 09/12/2010    carotid doppler; bilat blb/proximal ICAs demonstrated mild amount of fibrous plaque; mildly abnormal study     Past Surgical History  Procedure Laterality Date  . Glaucoma surgery    . Cardiac catheterization  05/22/1998    PTCA to completely occluded LAD at bifurcation of LAD & diagonal vessel (r/t VT/VF arrest);   Marland Kitchen Cataract extraction  2008    with intraocular lens implantation    Allergies  Allergen Reactions  . Pineapple Swelling    Current Outpatient Prescriptions  Medication Sig Dispense Refill  . amLODipine (NORVASC) 10 MG tablet Take 10 mg by mouth daily.     Marland Kitchen aspirin EC 81 MG tablet Take 81 mg by mouth daily.    . cloNIDine (CATAPRES) 0.2 MG tablet Take 0.2 mg by mouth 2 (two) times daily.    . fentaNYL (DURAGESIC - DOSED MCG/HR) 50 MCG/HR Place 1 patch onto the skin every 3 (three) days.    . Ferrous Sulfate (IRON) 325 (65 FE) MG TABS Take 1 tablet by mouth daily.    . furosemide (LASIX) 40 MG tablet Take 40 mg by mouth daily.    . isosorbide mononitrate (IMDUR) 60 MG 24  hr tablet Take 60 mg by mouth daily.    Marland Kitchen KRILL OIL OMEGA-3 PO Take 1 capsule by mouth daily.    . lansoprazole (PREVACID) 30 MG capsule Take 30 mg by mouth daily.    Marland Kitchen losartan (COZAAR) 50 MG tablet Take 25 mg by mouth 2 (two) times daily.     . Multiple Vitamin (MULITIVITAMIN WITH MINERALS) TABS Take 1 tablet by mouth daily.    Marland Kitchen olopatadine (PATANOL) 0.1 % ophthalmic solution Place 1 drop into the right eye daily.    . potassium chloride (K-DUR) 10 MEQ tablet Take 10 mEq by mouth daily.    . rosuvastatin (CRESTOR) 10 MG tablet Take 10 mg by mouth daily.    . travoprost, benzalkonium, (TRAVATAN) 0.004 % ophthalmic solution Place 1 drop into  both eyes at bedtime.     No current facility-administered medications for this visit.    Socially she is widowed. She has 2 children, 5 grandchildren, and 4 great-grandchildren.  ROS General: Negative; No fevers, chills, or night sweats;  HEENT: Negative; No changes in vision or hearing, sinus congestion, difficulty swallowing Pulmonary: Negative; No cough, wheezing, shortness of breath, hemoptysis Cardiovascular: Negative; No chest pain, presyncope, syncope, palpitations GI: Negative; No nausea, vomiting, diarrhea, or abdominal pain GU: Negative; No dysuria, hematuria, or difficulty voiding Musculoskeletal: Arthritic symptoms; no myalgias, joint pain, or weakness Hematologic/Oncology: Positive for anemia; no easy bruising, bleeding Endocrine: Negative; no heat/cold intolerance; no diabetes Neuro: Negative; no changes in balance, headaches Skin: Negative; No rashes or skin lesions Psychiatric: Negative; No behavioral problems, depression Sleep: Negative; No snoring, daytime sleepiness, hypersomnolence, bruxism, restless legs, hypnogognic hallucinations, no cataplexy Other comprehensive 14 point system review is negative.    PE BP 104/56 mmHg  Pulse 61  Ht 5' (1.524 m)  Wt 125 lb (56.7 kg)  BMI 24.41 kg/m2  Wt Readings from Last 3 Encounters:  03/08/15 125 lb (56.7 kg)  09/03/14 128 lb 14.4 oz (58.469 kg)  03/04/14 126 lb 8 oz (57.38 kg)  General: Alert, oriented, no distress.  Skin: normal turgor, no rashes HEENT: Normocephalic, atraumatic. Pupils round and reactive; sclera anicteric;no lid lag. No xanthelasmas Nose without nasal septal hypertrophy  Mouth/Parynx benign; Mallinpatti scale 2 Neck: No JVD, no carotid bruits fairly normal carotid upstroke. Lungs: clear to ausculatation and percussion; no wheezing or rales Chest wall: Nontender to Heart: RRR, s1 s2 normal 6-9/7 systolic murmur in the aortic region compatible with her mild aortic stenosis Abdomen: soft,  nontender; no hepatosplenomehaly, BS+; abdominal aorta nontender and not dilated by palpation. Back: No CVA tenderness Pulses 2+ Extremities: Significant arthritic changes of her hands; mild ulnar drift, left hand greater than right; no clubbing cyanosis or edema, Homan's sign negative  Neurologic: grossly nonfocal; cranial nerves grossly normal Psychologic: Normal cognition. Normal affect and mood   ECG (independently read by me): Normal sinus rhythm at 61 bpm.  LVH with repolarization changes.  Bivoltage criteria, particularly in aVL.  09/03/2014 ECG (independently read by me): Normal sinus rhythm at 64 bpm.  LVH with repolarization changes in lead aVL.  No additionalsignificant ST segment abnormalities  03/04/2014 ECG (independently read by me): Normal sinus rhythm at 73 beats per minute.  LVH with repolarization changes.  Prior February 2015 ECG (independently read by me): Sinus rhythm at 68 beats per minute. LVH by voltage criteria. Normal intervals   Prior ECG of 12/12/2012: Sinus rhythm at 55 beats per minute. PR 154 ms, QTc interval 411 milliseconds.  LABS:  BMP Latest  Ref Rng 10/15/2012 07/17/2011 07/16/2011  Glucose 70 - 99 mg/dL - 95 104(H)  BUN 6 - 23 mg/dL - 18 22  Creatinine 0.50 - 1.10 mg/dL - 0.92 1.16(H)  Sodium 135 - 145 mEq/L - 140 141  Potassium 3.5 - 5.1 mEq/L - 4.6 4.3  Chloride 96 - 112 mEq/L - 105 108  CO2 19 - 32 mEq/L - 25 25  Calcium 8.4 - 10.5 mg/dL 9.8 10.5 10.4    Hepatic Function Latest Ref Rng 07/17/2011 07/16/2011 07/15/2011  Total Protein 6.0 - 8.3 g/dL 7.0 6.6 6.1  Albumin 3.5 - 5.2 g/dL 2.8(L) 2.6(L) 2.5(L)  AST 0 - 37 U/L _0 ALT 0 - 35 U/L _1 Alk Phosphatase 39 - 117 U/L 73 65 59  Total Bilirubin 0.3 - 1.2 mg/dL 0.6 0.2(L) 0.2(L)  Bilirubin, Direct 0.0 - 0.3 mg/dL - - -    CBC Latest Ref Rng 07/17/2011 07/16/2011 07/15/2011  WBC 4.0 - 10.5 K/uL 5.6 6.0 4.9  Hemoglobin 12.0 - 15.0 g/dL 11.3(L) 7.9(L) 7.9(L)  Hematocrit 36.0 - 46.0 %  34.8(L) 25.3(L) 25.2(L)  Platelets 150 - 400 K/uL 232 254 231   Lab Results  Component Value Date   MCV 89.2 07/17/2011   MCV 91.7 07/16/2011   MCV 91.3 07/15/2011   Lab Results  Component Value Date   TSH 1.776 07/15/2011  No results found for: HGBA1C   Lipid Panel  No results found for: CHOL, TRIG, HDL, CHOLHDL, VLDL, LDLCALC, LDLDIRECT   BNP No results found for: PROBNP  Lipid Panel  No results found for: CHOL   RADIOLOGY: No results found.    ASSESSMENT AND PLAN: Ms. Reino is a remarkable 81 year old African-American female who suffered a VT/VF cardiac arrest on 05/22/1997 at age 15.  She was successfully resuscitated and taken emergently to the cardiac catheterization laboratory by me. She has continued to do remarkably well and has been without recurrent anginal symptomatology or signs or symptoms of congestive heart failure.  Her physical exam reveals mild aortic stenosis murmur.  She has had issues with anemia and has required iron therapy by [name].  She has had issues with arthritis.  Her blood pressure today is stable on therapy consisting of amlodipine 10 mg, clonidine 0.2 mg, furosemide 40 mg in addition to isosorbide mononitrate 60 mg and losartan 25 mg twice a day.  She continues to be on low-dose Crestor 10 mg for hyperlipidemia.  Dr. 2.  Seborrheic has been checking her laboratory.  I will last that her most recent laboratory in future labs be sent to me.  She is tolerating her medications.  She is not having any awareness of palpitations or arrhythmia.  We are continuing a medical therapy approach.  I will see her in 6 months for cardiology reevaluation.  Time spent: 25 minutes  Troy Sine, MD, [facility]  03/08/2015 6:58 PM

## 2015-03-08 NOTE — Patient Instructions (Addendum)
Your physician wants you to follow-up in: 6 months or sooner if needed. You will receive a reminder letter in the mail two months in advance. If you don't receive a letter, please call our office to schedule the follow-up appointment. 

## 2015-08-29 ENCOUNTER — Telehealth: Payer: Self-pay | Admitting: Cardiovascular Disease

## 2015-08-29 NOTE — Telephone Encounter (Signed)
Pt needs an appointment

## 2015-09-26 ENCOUNTER — Ambulatory Visit (INDEPENDENT_AMBULATORY_CARE_PROVIDER_SITE_OTHER): Payer: Medicare Other | Admitting: Cardiovascular Disease

## 2015-09-26 VITALS — BP 89/47 | HR 70 | Ht 60.0 in | Wt 121.0 lb

## 2015-09-26 DIAGNOSIS — N289 Disorder of kidney and ureter, unspecified: Secondary | ICD-10-CM

## 2015-09-26 DIAGNOSIS — E785 Hyperlipidemia, unspecified: Secondary | ICD-10-CM | POA: Diagnosis not present

## 2015-09-26 DIAGNOSIS — I252 Old myocardial infarction: Secondary | ICD-10-CM

## 2015-09-26 DIAGNOSIS — I359 Nonrheumatic aortic valve disorder, unspecified: Secondary | ICD-10-CM

## 2015-09-26 DIAGNOSIS — I350 Nonrheumatic aortic (valve) stenosis: Secondary | ICD-10-CM | POA: Diagnosis not present

## 2015-09-26 DIAGNOSIS — I10 Essential (primary) hypertension: Secondary | ICD-10-CM

## 2015-09-26 DIAGNOSIS — I2510 Atherosclerotic heart disease of native coronary artery without angina pectoris: Secondary | ICD-10-CM

## 2015-09-26 DIAGNOSIS — D649 Anemia, unspecified: Secondary | ICD-10-CM

## 2015-09-26 NOTE — Patient Instructions (Signed)
Your physician has requested that you have an echocardiogram. Echocardiography is a painless test that uses sound waves to create images of your heart. It provides your doctor with information about the size and shape of your heart and how well your heart's chambers and valves are working. This procedure takes approximately one hour. There are no restrictions for this procedure.    Your physician recommends that you schedule a follow-up appointment in: 2-3 months   

## 2015-09-28 ENCOUNTER — Telehealth: Payer: Self-pay | Admitting: *Deleted

## 2015-09-28 ENCOUNTER — Encounter: Payer: Self-pay | Admitting: Cardiovascular Disease

## 2015-09-28 DIAGNOSIS — N289 Disorder of kidney and ureter, unspecified: Secondary | ICD-10-CM | POA: Insufficient documentation

## 2015-09-28 DIAGNOSIS — D649 Anemia, unspecified: Secondary | ICD-10-CM | POA: Insufficient documentation

## 2015-09-28 NOTE — Telephone Encounter (Signed)
Left message to return a cal to me to discuss labs received from patient's PCP.

## 2015-09-28 NOTE — Progress Notes (Signed)
Patient ID: Brittany [name], female   DOB: [birth year], 82 y.o.   MRN: [id number]     Primary M.D.: [name]  HPI: Brittany [name] is a 82 y.o. female presents to the office for a 6 month cardiology evaluation.   [name] suffered an acute coronary syndrome complicated by VT/ VF cardiac arrest on 05/22/1997. At that time she was 82 years old.  I took her acutely to the cardiac catheterization laboratory and performed successful PTCA to a totally occluded LAD at the bifurcation of the LAD and diagonal vessel. Initially had significant thrombus burden. At the time, I did not stent her vessel since the lesion was in the region of a very large branch. In 2002 a repeat catheterization showed mild residual CAD  which has been treated with medical therapy.  She has a history of hypertension, mild valvular aortic stenosis, hyperlipidemia, as well as intermittent leg swelling. In the past she was taken off beta blocker therapy due to bradycardia.  She also has a history of anemia.  Since I last saw her, she has continued to do well without recurrent anginal symptoms.  She has  arthritic symptoms.  She denies significant shortness of breath. She is not able to ambulate well but uses a wheelchair.  She occasionally walks with a walker.   She denies PND, orthopnea She denies significant increasing leg swelling. She is unaware of palpitations. She denies presyncope or syncope.  Her daughter tells me that last week she had an MRI done of her gallbladder.  She had blood work done recently by Dr.Tisovic.  She specifically denies any chest pain.  She is unaware of palpitations.  She lives with her daughter.  She is sleeping with nocturnal oxygen.  She presents for evaluation.   Past Medical History  Diagnosis Date  . MI (myocardial infarction) (Brittany [name])   . Ventricular arrhythmia   . Hypertension   . CHF (congestive heart failure) (Brittany [name])   . Hyperlipemia   . GERD (gastroesophageal reflux  disease)   . Mild aortic valve stenosis   . Bilateral edema of lower extremity     intermittent   . CAD (coronary artery disease)   . History of echocardiogram 03/22/2009    EF >55%; normal LV size & systolic function; normal RV size & systolic function; mild aortic stenosis; mild MR; mild TR  . History of nuclear stress test 12/23/2008    normal perfusion to all regions; negative for ischemia; low risk scan; Dipyridamole   . History of Doppler ultrasound 09/12/2010    carotid doppler; bilat blb/proximal ICAs demonstrated mild amount of fibrous plaque; mildly abnormal study     Past Surgical History  Procedure Laterality Date  . Glaucoma surgery    . Cardiac catheterization  05/22/1998    PTCA to completely occluded LAD at bifurcation of LAD & diagonal vessel (r/t VT/VF arrest);   Marland Kitchen Cataract extraction  2008    with intraocular lens implantation    Allergies  Allergen Reactions  . Pineapple Swelling    Current Outpatient Prescriptions  Medication Sig Dispense Refill  . amLODipine (NORVASC) 10 MG tablet Take 10 mg by mouth daily.     Marland Kitchen aspirin EC 81 MG tablet Take 81 mg by mouth daily.    . cloNIDine (CATAPRES) 0.2 MG tablet Take 0.2 mg by mouth 2 (two) times daily.    . fentaNYL (DURAGESIC - DOSED MCG/HR) 50 MCG/HR Place 1 patch onto the skin every 3 (three) days.    Marland Kitchen  Ferrous Sulfate (IRON) 325 (65 FE) MG TABS Take 1 tablet by mouth daily.    . furosemide (LASIX) 40 MG tablet Take 40 mg by mouth daily.    . isosorbide mononitrate (IMDUR) 60 MG 24 hr tablet Take 60 mg by mouth daily.    Marland Kitchen KRILL OIL OMEGA-3 PO Take 1 capsule by mouth daily.    Marland Kitchen LATANOPROST OP Place 1 drop into both eyes 2 (two) times daily.    Marland Kitchen losartan (COZAAR) 50 MG tablet Take 25 mg by mouth 2 (two) times daily.     . Multiple Vitamin (MULITIVITAMIN WITH MINERALS) TABS Take 1 tablet by mouth daily.    Marland Kitchen OMEPRAZOLE PO Take 1 tablet by mouth daily.    . potassium chloride (K-DUR) 10 MEQ tablet Take 10 mEq by  mouth daily.    . rosuvastatin (CRESTOR) 10 MG tablet Take 10 mg by mouth daily.    . travoprost, benzalkonium, (TRAVATAN) 0.004 % ophthalmic solution Place 1 drop into both eyes at bedtime.     No current facility-administered medications for this visit.    Socially she is widowed. She has 2 children, 5 grandchildren, and 4 great-grandchildren.  ROS General: Negative; No fevers, chills, or night sweats;  HEENT: Negative; No changes in vision or hearing, sinus congestion, difficulty swallowing Pulmonary: Negative; No cough, wheezing, shortness of breath, hemoptysis Cardiovascular: Negative; No chest pain, presyncope, syncope, palpitations GI: Negative; No nausea, vomiting, diarrhea, or abdominal pain GU: Negative; No dysuria, hematuria, or difficulty voiding Musculoskeletal: Arthritic symptoms; no myalgias, joint pain, or weakness Hematologic/Oncology: Positive for anemia; no easy bruising, bleeding Endocrine: Negative; no heat/cold intolerance; no diabetes Neuro: Negative; no changes in balance, headaches Skin: Negative; No rashes or skin lesions Psychiatric: Negative; No behavioral problems, depression Sleep: Negative; No snoring, daytime sleepiness, hypersomnolence, bruxism, restless legs, hypnogognic hallucinations, no cataplexy Other comprehensive 14 point system review is negative.    PE BP 89/47 mmHg  Pulse 70  Ht 5' (1.524 m)  Wt 121 lb (54.885 kg)  BMI 23.63 kg/m2   Repeat blood pressure by me 98/50.  Wt Readings from Last 3 Encounters:  09/26/15 121 lb (54.885 kg)  03/08/15 125 lb (56.7 kg)  09/03/14 128 lb 14.4 oz (58.469 kg)  General: Alert, oriented, no distress.  Skin: normal turgor, no rashes HEENT: Normocephalic, atraumatic. Pupils round and reactive; sclera anicteric;no lid lag. No xanthelasmas Nose without nasal septal hypertrophy  Mouth/Parynx benign; Mallinpatti scale 2 Neck: No JVD, no carotid bruits fairly normal carotid upstroke. Lungs: clear to  ausculatation and percussion; no wheezing or rales Chest wall: Nontender to Heart: RRR, s1 s2 normal 6-7/2 systolic murmur in the aortic region compatible with her mild aortic stenosis Abdomen: soft, nontender; no hepatosplenomehaly, BS+; abdominal aorta nontender and not dilated by palpation. Back: No CVA tenderness Pulses 2+ Extremities: Significant arthritic changes of her hands with  ulnar drift, left hand greater than right; no clubbing cyanosis or edema, Homan's sign negative  Neurologic: grossly nonfocal; cranial nerves grossly normal Psychologic: Normal cognition. Normal affect and mood   ECG (independently read by me): Normal sinus rhythm at 70 bpm.  LVH with repolarization changes.  In comparison to her prior ECG, her ST segment changes are more pronounced inferiorly and laterally.  October 2016 ECG (independently read by me): Normal sinus rhythm at 61 bpm.  LVH with repolarization changes.  Bivoltage criteria, particularly in aVL.  09/03/2014 ECG (independently read by me): Normal sinus rhythm at 64 bpm.  LVH with repolarization  changes in lead aVL.  No additionalsignificant ST segment abnormalities  03/04/2014 ECG (independently read by me): Normal sinus rhythm at 73 beats per minute.  LVH with repolarization changes.  Prior February 2015 ECG (independently read by me): Sinus rhythm at 68 beats per minute. LVH by voltage criteria. Normal intervals   Prior ECG of 12/12/2012: Sinus rhythm at 55 beats per minute. PR 154 ms, QTc interval 411 milliseconds.  LABS: After she left the office  I was able to obtain recent blood work which was done at [facility] from 09/12/2015.  She had been contacted by [place] concerning the laboratory results.  I was unable to go over this with her since these labs were not available during her office visit.  However, by my review, creatinine is 1.9 with a BUN of 56.  At that time her potassium was 5.5.  She was anemic with a  hemoglobin of 9, hematocrit 27.9, MCV 102.  Of note, laboratory from November 2016 showed a creatinine of 0.8.  BMP Latest Ref Rng 10/15/2012 07/17/2011 07/16/2011  Glucose 70 - 99 mg/dL - 95 104(H)  BUN 6 - 23 mg/dL - 18 22  Creatinine 0.50 - 1.10 mg/dL - 0.92 1.16(H)  Sodium 135 - 145 mEq/L - 140 141  Potassium 3.5 - 5.1 mEq/L - 4.6 4.3  Chloride 96 - 112 mEq/L - 105 108  CO2 19 - 32 mEq/L - 25 25  Calcium 8.4 - 10.5 mg/dL 9.8 10.5 10.4    Hepatic Function Latest Ref Rng 07/17/2011 07/16/2011 07/15/2011  Total Protein 6.0 - 8.3 g/dL 7.0 6.6 6.1  Albumin 3.5 - 5.2 g/dL 2.8(L) 2.6(L) 2.5(L)  AST 0 - 37 U/L _0 ALT 0 - 35 U/L _1 Alk Phosphatase 39 - 117 U/L 73 65 59  Total Bilirubin 0.3 - 1.2 mg/dL 0.6 0.2(L) 0.2(L)  Bilirubin, Direct 0.0 - 0.3 mg/dL - - -    CBC Latest Ref Rng 07/17/2011 07/16/2011 07/15/2011  WBC 4.0 - 10.5 K/uL 5.6 6.0 4.9  Hemoglobin 12.0 - 15.0 g/dL 11.3(L) 7.9(L) 7.9(L)  Hematocrit 36.0 - 46.0 % 34.8(L) 25.3(L) 25.2(L)  Platelets 150 - 400 K/uL 232 254 231   Lab Results  Component Value Date   MCV 89.2 07/17/2011   MCV 91.7 07/16/2011   MCV 91.3 07/15/2011   Lab Results  Component Value Date   TSH 1.776 07/15/2011  No results found for: HGBA1C   Lipid Panel  No results found for: CHOL, TRIG, HDL, CHOLHDL, VLDL, LDLCALC, LDLDIRECT   BNP No results found for: PROBNP  Lipid Panel  No results found for: CHOL   RADIOLOGY: No results found.    ASSESSMENT AND PLAN: Ms. Mones is a remarkable 82 year old African-American female who suffered a VT/VF cardiac arrest on 05/22/1997 at age 54.  She was successfully resuscitated and taken emergently to the cardiac catheterization laboratory by me. She has continued to do remarkably well and has been without recurrent anginal symptomatology or signs or symptoms of congestive heart failure.  She will be turning 89 in June.  Her physical exam reveals an aortic stenosis murmur.  She has had issues  with anemia and has required iron therapy by [name].  She has had issues with arthritis.  Her ECG today shows more prominent.  LVH with more significant inferolateral ST-T changes.  She is asymptomatic with reference to chest pain or shortness of breath.  Is possible that this may be  contributed by her aortic stenosis.  Her last echo Doppler study was in 2010 and we have been monitoring her with medical therapy.  However, last week she had undergone an MRI of her gallbladder.  With her ECG changes.  I have suggested insinuation for a follow-up echo Doppler study, so we are certain that we are treating her appropriately.  With her age there is no intent for invasive treatment or surgery.  With her renal insufficiency she may need to hold losartan and if she is on supplemental potassium this will need to be discontinued.  I would recommend follow-up blood work to reassess renal function, potassium status and anemia.  I will see her back in the office for follow-up evaluation. Time spent: 25 minutes  Troy Sine, MD, [facility]  09/28/2015 5:36 PM

## 2015-09-29 ENCOUNTER — Telehealth: Payer: Self-pay | Admitting: *Deleted

## 2015-09-29 DIAGNOSIS — R899 Unspecified abnormal finding in specimens from other organs, systems and tissues: Secondary | ICD-10-CM

## 2015-09-29 DIAGNOSIS — Z79899 Other long term (current) drug therapy: Secondary | ICD-10-CM

## 2015-09-29 NOTE — Telephone Encounter (Signed)
Left message to return a call to discuss some medication changes.

## 2015-09-29 NOTE — Telephone Encounter (Signed)
Left message per Dr Tresa EndoKelly to hold the Losartan. Stop the potassium and have lab rechecked next week. If her PCP has already dealt with this she can disregard these orders. She can call me on Monday if she has any questions.

## 2015-10-04 ENCOUNTER — Telehealth: Payer: Self-pay | Admitting: Cardiovascular Disease

## 2015-10-04 NOTE — Telephone Encounter (Signed)
New message     Pt is calling to talk to MertonWanda.  She has a few questions for her

## 2015-10-05 NOTE — Telephone Encounter (Signed)
Returned a call to patient's daughter. She called to confirm the previous message that I left her on Friday. Understanding of the instructions was stated. She will have mom to get a repeat B-MET next week. The medication changes were just implemented yesterday.

## 2015-10-13 LAB — BASIC METABOLIC PANEL
BUN: 21 mg/dL (ref 7–25)
CALCIUM: 10.1 mg/dL (ref 8.6–10.4)
CHLORIDE: 105 mmol/L (ref 98–110)
CO2: 21 mmol/L (ref 20–31)
CREATININE: 0.85 mg/dL (ref 0.60–0.88)
Glucose, Bld: 99 mg/dL (ref 65–99)
Potassium: 3.8 mmol/L (ref 3.5–5.3)
Sodium: 138 mmol/L (ref 135–146)

## 2015-10-14 ENCOUNTER — Other Ambulatory Visit: Payer: Self-pay

## 2015-10-14 ENCOUNTER — Ambulatory Visit (HOSPITAL_COMMUNITY): Payer: Medicare Other | Attending: Cardiovascular Disease

## 2015-10-14 DIAGNOSIS — I2510 Atherosclerotic heart disease of native coronary artery without angina pectoris: Secondary | ICD-10-CM | POA: Diagnosis not present

## 2015-10-14 DIAGNOSIS — I352 Nonrheumatic aortic (valve) stenosis with insufficiency: Secondary | ICD-10-CM | POA: Insufficient documentation

## 2015-10-14 DIAGNOSIS — I252 Old myocardial infarction: Secondary | ICD-10-CM | POA: Diagnosis not present

## 2015-10-14 DIAGNOSIS — I359 Nonrheumatic aortic valve disorder, unspecified: Secondary | ICD-10-CM | POA: Insufficient documentation

## 2015-10-14 DIAGNOSIS — I517 Cardiomegaly: Secondary | ICD-10-CM | POA: Insufficient documentation

## 2015-10-14 DIAGNOSIS — I509 Heart failure, unspecified: Secondary | ICD-10-CM | POA: Diagnosis not present

## 2015-11-11 ENCOUNTER — Encounter: Payer: Self-pay | Admitting: *Deleted

## 2016-01-09 ENCOUNTER — Ambulatory Visit (INDEPENDENT_AMBULATORY_CARE_PROVIDER_SITE_OTHER): Payer: Medicare Other | Admitting: Cardiovascular Disease

## 2016-01-09 ENCOUNTER — Encounter: Payer: Self-pay | Admitting: Cardiovascular Disease

## 2016-01-09 ENCOUNTER — Encounter (INDEPENDENT_AMBULATORY_CARE_PROVIDER_SITE_OTHER): Payer: Self-pay

## 2016-01-09 VITALS — BP 136/64 | HR 69 | Ht 60.0 in | Wt 115.0 lb

## 2016-01-09 DIAGNOSIS — I2510 Atherosclerotic heart disease of native coronary artery without angina pectoris: Secondary | ICD-10-CM | POA: Diagnosis not present

## 2016-01-09 DIAGNOSIS — I359 Nonrheumatic aortic valve disorder, unspecified: Secondary | ICD-10-CM | POA: Diagnosis not present

## 2016-01-09 DIAGNOSIS — E785 Hyperlipidemia, unspecified: Secondary | ICD-10-CM

## 2016-01-09 DIAGNOSIS — I10 Essential (primary) hypertension: Secondary | ICD-10-CM

## 2016-01-09 DIAGNOSIS — I350 Nonrheumatic aortic (valve) stenosis: Secondary | ICD-10-CM

## 2016-01-09 NOTE — Patient Instructions (Signed)
Your physician wants you to follow-up in: 6 months or sooner if needed. You will receive a reminder letter in the mail two months in advance. If you don't receive a letter, please call our office to schedule the follow-up appointment.   If you need a refill on your cardiac medications before your next appointment, please call your pharmacy. 

## 2016-01-10 ENCOUNTER — Encounter: Payer: Self-pay | Admitting: Cardiovascular Disease

## 2016-01-10 NOTE — Progress Notes (Signed)
Patient ID: GENIENE LIST, female   DOB: 11/19/1910, 82 y.o.   MRN: [id number]     Primary M.D.: [name]  HPI: Brittany [name] is a 82 y.o. female presents to the office for a 4 month cardiology evaluation.   Brittany [name] suffered an acute coronary syndrome complicated by VT/ VF cardiac arrest on 05/22/1997. At that time she was 82 years old.  I took her acutely to the cardiac catheterization laboratory and performed successful PTCA to a totally occluded LAD at the bifurcation of the LAD and diagonal vessel. Initially had significant thrombus burden. At the time, I did not stent her vessel since the lesion was in the region of a very large branch. In 2002 a repeat catheterization showed mild residual CAD  which has been treated with medical therapy.  She has a history of hypertension, mild valvular aortic stenosis, hyperlipidemia, as well as intermittent leg swelling. In the past she was taken off beta blocker therapy due to bradycardia.  She also has a history of anemia.  She has continued to do well without recurrent anginal symptoms.  She has  arthritic symptoms.  She denies significant shortness of breath. She is not able to ambulate well but uses a wheelchair.  She occasionally walks with a walker.   She denies PND, orthopnea She denies significant increasing leg swelling. She is unaware of palpitations. She denies presyncope or syncope.  I saw her 4 months ago she had an MRI done of her gallbladder.  She had blood work doneby Dr.Tisovic.    She lives with her daughter.  She is sleeping with nocturnal oxygen.    The concerns for potential progressive aortic stenosis.  She underwent a follow-up echo Doppler study which was done on 10/14/2015.  This showed an ejection fraction of 55-60% with normal wall motion.  There was grade 1 diastolic dysfunction.  There was only mild AST with a mean gradient of 13 and a peak gradient of 26 mm.  She specifically denies any chest pain.   She is unaware of palpitations. She denies PND, orthopnea.  Repeat blood work showed improvement in renal function.  She presents for reevaluation.   Past Medical History:  Diagnosis Date  . Bilateral edema of lower extremity    intermittent   . CAD (coronary artery disease)   . CHF (congestive heart failure) ([place])   . GERD (gastroesophageal reflux disease)   . History of Doppler ultrasound 09/12/2010   carotid doppler; bilat blb/proximal ICAs demonstrated mild amount of fibrous plaque; mildly abnormal study   . History of echocardiogram 03/22/2009   EF >55%; normal LV size & systolic function; normal RV size & systolic function; mild aortic stenosis; mild MR; mild TR  . History of nuclear stress test 12/23/2008   normal perfusion to all regions; negative for ischemia; low risk scan; Dipyridamole   . Hyperlipemia   . Hypertension   . MI (myocardial infarction) ([place])   . Mild aortic valve stenosis   . Ventricular arrhythmia     Past Surgical History:  Procedure Laterality Date  . CARDIAC CATHETERIZATION  05/22/1998   PTCA to completely occluded LAD at bifurcation of LAD & diagonal vessel (r/t VT/VF arrest);   Marland Kitchen CATARACT EXTRACTION  2008   with intraocular lens implantation  . GLAUCOMA SURGERY      Allergies  Allergen Reactions  . Pineapple Swelling    Current Outpatient Prescriptions  Medication Sig Dispense Refill  . amLODipine (NORVASC) 10 MG tablet Take  10 mg by mouth daily.     Marland Kitchen aspirin EC 81 MG tablet Take 81 mg by mouth daily.    . cloNIDine (CATAPRES) 0.2 MG tablet Take 0.2 mg by mouth 2 (two) times daily.    . fentaNYL (DURAGESIC - DOSED MCG/HR) 50 MCG/HR Place 1 patch onto the skin every 3 (three) days.    . Ferrous Sulfate (IRON) 325 (65 FE) MG TABS Take 1 tablet by mouth daily.    . furosemide (LASIX) 40 MG tablet Take 40 mg by mouth daily.    . isosorbide mononitrate (IMDUR) 60 MG 24 hr tablet Take 60 mg by mouth daily.    Marland Kitchen KRILL OIL OMEGA-3 PO Take 1 capsule  by mouth daily.    Marland Kitchen losartan (COZAAR) 50 MG tablet Take 25 mg by mouth 2 (two) times daily.     . Multiple Vitamin (MULITIVITAMIN WITH MINERALS) TABS Take 1 tablet by mouth daily.    Marland Kitchen OMEPRAZOLE PO Take 1 tablet by mouth daily.    . rosuvastatin (CRESTOR) 10 MG tablet Take 10 mg by mouth daily.     No current facility-administered medications for this visit.     Socially she is widowed. She has 2 children, 5 grandchildren, and 4 great-grandchildren.  ROS General: Negative; No fevers, chills, or night sweats;  HEENT: Negative; No changes in vision or hearing, sinus congestion, difficulty swallowing Pulmonary: Negative; No cough, wheezing, shortness of breath, hemoptysis Cardiovascular: Negative; No chest pain, presyncope, syncope, palpitations GI: Negative; No nausea, vomiting, diarrhea, or abdominal pain GU: Negative; No dysuria, hematuria, or difficulty voiding Musculoskeletal: Positive for joint deformity of her hands and arthritic symptoms; no myalgias, joint pain, or weakness Hematologic/Oncology: Positive for anemia; no easy bruising, bleeding Endocrine: Negative; no heat/cold intolerance; no diabetes Neuro: Negative; no changes in balance, headaches Skin: Negative; No rashes or skin lesions Psychiatric: Negative; No behavioral problems, depression Sleep: Negative; No snoring, daytime sleepiness, hypersomnolence, bruxism, restless legs, hypnogognic hallucinations, no cataplexy Other comprehensive 14 point system review is negative.    PE BP 136/64   Pulse 69   Ht 5' (1.524 m)   Wt 115 lb (52.2 kg)   BMI 22.46 kg/m    Repeat blood pressure by me 122/60  Wt Readings from Last 3 Encounters:  01/09/16 115 lb (52.2 kg)  09/26/15 121 lb (54.9 kg)  03/08/15 125 lb (56.7 kg)  General: Alert, oriented, no distress.  Skin: normal turgor, no rashes HEENT: Normocephalic, atraumatic. Pupils round and reactive; sclera anicteric;no lid lag. No xanthelasmas Nose without nasal  septal hypertrophy  Mouth/Parynx benign; Mallinpatti scale 2 Neck: No JVD, no carotid bruits fairly normal carotid upstroke. Lungs: clear to ausculatation and percussion; no wheezing or rales Chest wall: Nontender to Heart: RRR, s1 s2 normal 0-6/2 systolic murmur in the aortic region compatible with her mild aortic stenosis Abdomen: soft, nontender; no hepatosplenomehaly, BS+; abdominal aorta nontender and not dilated by palpation. Back: No CVA tenderness Pulses 2+ Extremities: Significant arthritic changes of her hands with  ulnar drift, left hand greater than right; no clubbing cyanosis or edema, Homan's sign negative  Neurologic: grossly nonfocal; cranial nerves grossly normal Psychologic: Normal cognition. Normal affect and mood   ECG (independently read by me): Normal sinus rhythm at 69 bpm.  LVH with repolarization changes in leads 1 and L.  April 2017 ECG (independently read by me): Normal sinus rhythm at 70 bpm.  LVH with repolarization changes.  In comparison to her prior ECG, her ST segment changes  are more pronounced inferiorly and laterally.  October 2016 ECG (independently read by me): Normal sinus rhythm at 61 bpm.  LVH with repolarization changes.  Bivoltage criteria, particularly in aVL.  09/03/2014 ECG (independently read by me): Normal sinus rhythm at 64 bpm.  LVH with repolarization changes in lead aVL.  No additionalsignificant ST segment abnormalities  03/04/2014 ECG (independently read by me): Normal sinus rhythm at 73 beats per minute.  LVH with repolarization changes.  Prior February 2015 ECG (independently read by me): Sinus rhythm at 68 beats per minute. LVH by voltage criteria. Normal intervals   Prior ECG of 12/12/2012: Sinus rhythm at 55 beats per minute. PR 154 ms, QTc interval 411 milliseconds.  LABS: Laboratory at [facility] from 09/12/2015.  She had been contacted by [place] concerning the laboratory results.  I was unable to go  over this with her since these labs were not available during her office visit.  However, by my review, creatinine is 1.9 with a BUN of 56.  At that time her potassium was 5.5.  She was anemic with a hemoglobin of 9, hematocrit 27.9, MCV 102.  Of note, laboratory from November 2016 showed a creatinine of 0.8.  Repeat blood work done by me from 10/12/2015 showed a BUN of 21, creatinine 0.85  BMP Latest Ref Rng & Units 10/12/2015 10/15/2012 07/17/2011  Glucose 65 - 99 mg/dL 99 - 95  BUN 7 - 25 mg/dL 21 - 18  Creatinine 0.60 - 0.88 mg/dL 0.85 - 0.92  Sodium 135 - 146 mmol/L 138 - 140  Potassium 3.5 - 5.3 mmol/L 3.8 - 4.6  Chloride 98 - 110 mmol/L 105 - 105  CO2 20 - 31 mmol/L 21 - 25  Calcium 8.6 - 10.4 mg/dL 10.1 9.8 10.5    Hepatic Function Latest Ref Rng & Units 07/17/2011 07/16/2011 07/15/2011  Total Protein 6.0 - 8.3 g/dL 7.0 6.6 6.1  Albumin 3.5 - 5.2 g/dL 2.8(L) 2.6(L) 2.5(L)  AST 0 - 37 U/L 17 17 14   ALT 0 - 35 U/L 9 8 5   Alk Phosphatase 39 - 117 U/L 73 65 59  Total Bilirubin 0.3 - 1.2 mg/dL 0.6 0.2(L) 0.2(L)  Bilirubin, Direct 0.0 - 0.3 mg/dL - - -    CBC Latest Ref Rng & Units 07/17/2011 07/16/2011 07/15/2011  WBC 4.0 - 10.5 K/uL 5.6 6.0 4.9  Hemoglobin 12.0 - 15.0 g/dL 11.3(L) 7.9(L) 7.9(L)  Hematocrit 36.0 - 46.0 % 34.8(L) 25.3(L) 25.2(L)  Platelets 150 - 400 K/uL 232 254 231   Lab Results  Component Value Date   MCV 89.2 07/17/2011   MCV 91.7 07/16/2011   MCV 91.3 07/15/2011   Lab Results  Component Value Date   TSH 1.776 07/15/2011  No results found for: HGBA1C   Lipid Panel  No results found for: CHOL, TRIG, HDL, CHOLHDL, VLDL, LDLCALC, LDLDIRECT   BNP No results found for: PROBNP  Lipid Panel  No results found for: CHOL   RADIOLOGY: No results found.    ASSESSMENT AND PLAN: Ms. Ginty is a remarkable 82 year old African-American female who suffered a VT/VF cardiac arrest on 05/22/1997 at age 89.  She was successfully resuscitated and taken  emergently to the cardiac catheterization laboratory by me. She has continued to do remarkably well and has been without recurrent anginal symptomatology or signs or symptoms of congestive heart failure. When I last saw her, her EKG revealed more prominent LVH with more significant inferolateral ST-T changes.  I recommended  that she undergo a follow-up echo Doppler study to reassess her aortic valve stenosis.  This continues to be mild and she continues to have normal systolic function with only grade 1 diastolic dysfunction.  I reviewed with her today her advanced directives.  Clinically, she continues to do remarkably well and remains very alert, although ambulation is restricted due to arthritic symptoms.  Presently, she is euvolemic.  Her blood pressure is controlled on amlodipine 10 mg, furosemide 40 mg, clonidine 0.2 mg and losartan 25 mg twice a day.  She continues to take Crestor 10 mg for hyperlipidemia.  As long as she remains stable, I will see her in 6 months for reevaluation.  Time spent: 25 minutes  Troy Sine, MD, [facility]  01/10/2016 8:17 PM

## 2016-08-15 ENCOUNTER — Ambulatory Visit (INDEPENDENT_AMBULATORY_CARE_PROVIDER_SITE_OTHER): Payer: Medicare Other | Admitting: Cardiovascular Disease

## 2016-08-15 ENCOUNTER — Encounter: Payer: Self-pay | Admitting: Cardiovascular Disease

## 2016-08-15 VITALS — BP 130/76 | HR 74 | Ht 60.0 in | Wt 113.0 lb

## 2016-08-15 DIAGNOSIS — I350 Nonrheumatic aortic (valve) stenosis: Secondary | ICD-10-CM | POA: Diagnosis not present

## 2016-08-15 DIAGNOSIS — I2510 Atherosclerotic heart disease of native coronary artery without angina pectoris: Secondary | ICD-10-CM | POA: Diagnosis not present

## 2016-08-15 DIAGNOSIS — I10 Essential (primary) hypertension: Secondary | ICD-10-CM

## 2016-08-15 DIAGNOSIS — I252 Old myocardial infarction: Secondary | ICD-10-CM

## 2016-08-15 DIAGNOSIS — N289 Disorder of kidney and ureter, unspecified: Secondary | ICD-10-CM

## 2016-08-15 DIAGNOSIS — E785 Hyperlipidemia, unspecified: Secondary | ICD-10-CM | POA: Diagnosis not present

## 2016-08-15 NOTE — Patient Instructions (Addendum)
Your physician wants you to follow-up in: 6 months or sooner if needed. You will receive a reminder letter in the mail two months in advance. If you don't receive a letter, please call our office to schedule the follow-up appointment.   If you need a refill on your cardiac medications before your next appointment, please call your pharmacy. 

## 2016-08-15 NOTE — Progress Notes (Signed)
Patient ID: AALIVIA MCGRAW, female   DOB: June 14, 1910, 83 y.o.   MRN: [id number]     Primary M.D.: [name]  HPI: MAHAILA TISCHER is a 83 y.o. female presents to the office for a 7 month cardiology evaluation.   Ms. Releford suffered an acute coronary syndrome complicated by VT/ VF cardiac arrest on 05/22/1997. At that time she was 83 years old.  I took her acutely to the cardiac catheterization laboratory and performed successful PTCA to a totally occluded LAD at the bifurcation of the LAD and diagonal vessel. Initially had significant thrombus burden. At the time, I did not stent her vessel since the lesion was in the region of a very large branch. In 2002 a repeat catheterization showed mild residual CAD  which has been treated with medical therapy.  She has a history of hypertension, mild valvular aortic stenosis, hyperlipidemia, as well as intermittent leg swelling. In the past she was taken off beta blocker therapy due to bradycardia.  She also has a history of anemia.  She has continued to do well without recurrent anginal symptoms.  She has  arthritic symptoms.  She denies significant shortness of breath. She is not able to ambulate well but uses a wheelchair.  She occasionally walks with a walker.   She denies PND, orthopnea She denies significant increasing leg swelling. She is unaware of palpitations. She denies presyncope or syncope.  Due to concerns for potential progressive aortic stenosis she underwent a follow-up echo Doppler study which was done on 10/14/2015.  This showed an ejection fraction of 55-60% with normal wall motion.  There was grade 1 diastolic dysfunction.  There was only mild AS with a mean gradient of 13 and a peak gradient of 26 mm.  She uses nocturnal oxygen.  She specifically denies any chest pain.  She is unaware of palpitations. She denies PND, orthopnea.  Repeat blood work showed improvement in renal function.  She has issues with arthritis,  particularly involving her hands.  She still walks a limited fashion with a walker..  She is now a great great grandmother.  She presents for reevaluation.   Past Medical History:  Diagnosis Date  . Bilateral edema of lower extremity    intermittent   . CAD (coronary artery disease)   . CHF (congestive heart failure) ([place])   . GERD (gastroesophageal reflux disease)   . History of Doppler ultrasound 09/12/2010   carotid doppler; bilat blb/proximal ICAs demonstrated mild amount of fibrous plaque; mildly abnormal study   . History of echocardiogram 03/22/2009   EF >55%; normal LV size & systolic function; normal RV size & systolic function; mild aortic stenosis; mild MR; mild TR  . History of nuclear stress test 12/23/2008   normal perfusion to all regions; negative for ischemia; low risk scan; Dipyridamole   . Hyperlipemia   . Hypertension   . MI (myocardial infarction)   . Mild aortic valve stenosis   . Ventricular arrhythmia     Past Surgical History:  Procedure Laterality Date  . CARDIAC CATHETERIZATION  05/22/1998   PTCA to completely occluded LAD at bifurcation of LAD & diagonal vessel (r/t VT/VF arrest);   Marland Kitchen CATARACT EXTRACTION  2008   with intraocular lens implantation  . GLAUCOMA SURGERY      Allergies  Allergen Reactions  . Pineapple Swelling    Current Outpatient Prescriptions  Medication Sig Dispense Refill  . amLODipine (NORVASC) 10 MG tablet Take 10 mg by mouth daily.     Marland Kitchen  aspirin EC 81 MG tablet Take 81 mg by mouth daily.    . cloNIDine (CATAPRES) 0.2 MG tablet Take 0.2 mg by mouth 2 (two) times daily.    . fentaNYL (DURAGESIC - DOSED MCG/HR) 50 MCG/HR Place 1 patch onto the skin every 3 (three) days.    . furosemide (LASIX) 40 MG tablet Take 40 mg by mouth daily.    . isosorbide mononitrate (IMDUR) 60 MG 24 hr tablet Take 60 mg by mouth daily.    Marland Kitchen KRILL OIL OMEGA-3 PO Take 1 capsule by mouth daily.    . Multiple Vitamin (MULITIVITAMIN WITH MINERALS) TABS Take  1 tablet by mouth daily.    Marland Kitchen OMEPRAZOLE PO Take 1 tablet by mouth daily.    . rosuvastatin (CRESTOR) 10 MG tablet Take 10 mg by mouth daily.     No current facility-administered medications for this visit.     Socially she is widowed. She has 2 children, 5 grandchildren, and 4 great-grandchildren.  ROS General: Negative; No fevers, chills, or night sweats;  HEENT: Negative; No changes in vision or hearing, sinus congestion, difficulty swallowing Pulmonary: Negative; No cough, wheezing, shortness of breath, hemoptysis Cardiovascular: Negative; No chest pain, presyncope, syncope, palpitations GI: Negative; No nausea, vomiting, diarrhea, or abdominal pain GU: Negative; No dysuria, hematuria, or difficulty voiding Musculoskeletal: Positive for joint deformity of her hands and arthritic symptoms; no myalgias, joint pain, or weakness Hematologic/Oncology: Positive for anemia; no easy bruising, bleeding Endocrine: Negative; no heat/cold intolerance; no diabetes Neuro: Negative; no changes in balance, headaches Skin: Negative; No rashes or skin lesions Psychiatric: Negative; No behavioral problems, depression Sleep: Negative; No snoring, daytime sleepiness, hypersomnolence, bruxism, restless legs, hypnogognic hallucinations, no cataplexy Other comprehensive 14 point system review is negative.    PE BP 130/76   Pulse 74   Ht 5' (1.524 m)   Wt 113 lb (51.3 kg)   BMI 22.07 kg/m    Repeat blood pressure by me 126/68  Wt Readings from Last 3 Encounters:  08/15/16 113 lb (51.3 kg)  01/09/16 115 lb (52.2 kg)  09/26/15 121 lb (54.9 kg)  General: Alert, oriented, no distress.  Skin: normal turgor, no rashes HEENT: Normocephalic, atraumatic. Pupils round and reactive; sclera anicteric;no lid lag. No xanthelasmas Nose without nasal septal hypertrophy  Mouth/Parynx benign; Mallinpatti scale 2 Neck: No JVD, no carotid bruits fairly normal carotid upstroke. Lungs: clear to ausculatation and  percussion; no wheezing or rales Chest wall: Nontender to Heart: RRR, s1 s2 normal 1-5/1 systolic murmur in the aortic region compatible with her mild aortic stenosis Abdomen: soft, nontender; no hepatosplenomehaly, BS+; abdominal aorta nontender and not dilated by palpation. Back: No CVA tenderness Pulses 2+ Extremities: Significant arthritic changes of her hands with  ulnar drift, left hand greater than right; no clubbing cyanosis or edema, Homan's sign negative  Neurologic: grossly nonfocal; cranial nerves grossly normal Psychologic: Normal cognition. Normal affect and mood   ECG (independently read by me): Normal sinus rhythm at 74 bpm.  LVH with repolarization changes.  August 2017 ECG (independently read by me): Normal sinus rhythm at 69 bpm.  LVH with repolarization changes in leads 1 and L.  April 2017 ECG (independently read by me): Normal sinus rhythm at 70 bpm.  LVH with repolarization changes.  In comparison to her prior ECG, her ST segment changes are more pronounced inferiorly and laterally.  October 2016 ECG (independently read by me): Normal sinus rhythm at 61 bpm.  LVH with repolarization changes.  Bivoltage criteria,  particularly in aVL.  09/03/2014 ECG (independently read by me): Normal sinus rhythm at 64 bpm.  LVH with repolarization changes in lead aVL.  No additionalsignificant ST segment abnormalities  03/04/2014 ECG (independently read by me): Normal sinus rhythm at 73 beats per minute.  LVH with repolarization changes.  Prior February 2015 ECG (independently read by me): Sinus rhythm at 68 beats per minute. LVH by voltage criteria. Normal intervals   Prior ECG of 12/12/2012: Sinus rhythm at 55 beats per minute. PR 154 ms, QTc interval 411 milliseconds.  LABS: Laboratory at [facility] from 09/12/2015.  She had been contacted by [place] concerning the laboratory results.  I was unable to go over this with her since these labs were not  available during her office visit.  However, by my review, creatinine is 1.9 with a BUN of 56.  At that time her potassium was 5.5.  She was anemic with a hemoglobin of 9, hematocrit 27.9, MCV 102.  Of note, laboratory from November 2016 showed a creatinine of 0.8.  Repeat blood work done by me from 10/12/2015 showed a BUN of 21, creatinine 0.85  She had recent laboratory done by [name] this year.  I will will try to obtain these results.  BMP Latest Ref Rng & Units 10/12/2015 10/15/2012 07/17/2011  Glucose 65 - 99 mg/dL 99 - 95  BUN 7 - 25 mg/dL 21 - 18  Creatinine 0.60 - 0.88 mg/dL 0.85 - 0.92  Sodium 135 - 146 mmol/L 138 - 140  Potassium 3.5 - 5.3 mmol/L 3.8 - 4.6  Chloride 98 - 110 mmol/L 105 - 105  CO2 20 - 31 mmol/L 21 - 25  Calcium 8.6 - 10.4 mg/dL 10.1 9.8 10.5    Hepatic Function Latest Ref Rng & Units 07/17/2011 07/16/2011 07/15/2011  Total Protein 6.0 - 8.3 g/dL 7.0 6.6 6.1  Albumin 3.5 - 5.2 g/dL 2.8(L) 2.6(L) 2.5(L)  AST 0 - 37 U/L 17 17 14   ALT 0 - 35 U/L 9 8 5   Alk Phosphatase 39 - 117 U/L 73 65 59  Total Bilirubin 0.3 - 1.2 mg/dL 0.6 0.2(L) 0.2(L)  Bilirubin, Direct 0.0 - 0.3 mg/dL - - -    CBC Latest Ref Rng & Units 07/17/2011 07/16/2011 07/15/2011  WBC 4.0 - 10.5 K/uL 5.6 6.0 4.9  Hemoglobin 12.0 - 15.0 g/dL 11.3(L) 7.9(L) 7.9(L)  Hematocrit 36.0 - 46.0 % 34.8(L) 25.3(L) 25.2(L)  Platelets 150 - 400 K/uL 232 254 231   Lab Results  Component Value Date   MCV 89.2 07/17/2011   MCV 91.7 07/16/2011   MCV 91.3 07/15/2011   Lab Results  Component Value Date   TSH 1.776 07/15/2011  No results found for: HGBA1C   Lipid Panel  No results found for: CHOL, TRIG, HDL, CHOLHDL, VLDL, LDLCALC, LDLDIRECT   BNP No results found for: PROBNP  Lipid Panel  No results found for: CHOL   RADIOLOGY: No results found.  IMPRESSION:  1. CAD in native artery   2. Essential hypertension   3. Hyperlipidemia with target LDL less than 70   4. Old MI (myocardial  infarction)   5. Renal insufficiency   6. Mild aortic stenosis by prior echocardiogram     ASSESSMENT AND PLAN: Ms. Broski is a remarkable 83 year old African-American female who suffered a VT/VF cardiac arrest on 05/22/1997 at age 76. She will be turning 89 years in June 2018.  She was successfully resuscitated and taken emergently to the cardiac  catheterization laboratory by me. She has continued to do remarkably well and has been without recurrent anginal symptomatology or signs or symptoms of congestive heart failure. When I  saw her last year her EKG revealed more prominent LVH with more significant inferolateral ST-T changes.  I recommended that she undergo a follow-up echo Doppler study to reassess her aortic valve stenosis.  This continues to be mild and she continues to have normal systolic function with only grade 1 diastolic dysfunction.  Her blood pressure today is controlled on amlodipine 10 mg, clonidine 0.2 mg twice a day, furosemide 40 mg daily, in addition to her isosorbide 60 mg.  She is not having any recurrent anginal symptomatology.  She continues to be on low-dose statin therapy with rosuvastatin 10 mg.  She also takes a Duragesic patch for her arthritic discomfort.  She also has taken krill oil omega-3 supplementation.  She continues to do remarkably well.  Her hemodynamics are stable.  Her ECG is unchanged.  I will see her in 6 months for reevaluation.  Troy Sine, MD, [facility]  08/17/2016 11:17 AM

## 2016-08-25 ENCOUNTER — Emergency Department (HOSPITAL_COMMUNITY): Payer: Medicare Other

## 2016-08-25 ENCOUNTER — Inpatient Hospital Stay (HOSPITAL_COMMUNITY)
Admission: EM | Admit: 2016-08-25 | Discharge: 2016-09-02 | DRG: 070 | Disposition: E | Payer: Medicare Other | Attending: Internal Medicine | Admitting: Internal Medicine

## 2016-08-25 ENCOUNTER — Encounter (HOSPITAL_COMMUNITY): Payer: Self-pay

## 2016-08-25 DIAGNOSIS — E785 Hyperlipidemia, unspecified: Secondary | ICD-10-CM

## 2016-08-25 DIAGNOSIS — Z66 Do not resuscitate: Secondary | ICD-10-CM | POA: Diagnosis present

## 2016-08-25 DIAGNOSIS — I5032 Chronic diastolic (congestive) heart failure: Secondary | ICD-10-CM | POA: Diagnosis present

## 2016-08-25 DIAGNOSIS — J9611 Chronic respiratory failure with hypoxia: Secondary | ICD-10-CM | POA: Diagnosis present

## 2016-08-25 DIAGNOSIS — I252 Old myocardial infarction: Secondary | ICD-10-CM

## 2016-08-25 DIAGNOSIS — E870 Hyperosmolality and hypernatremia: Secondary | ICD-10-CM | POA: Diagnosis present

## 2016-08-25 DIAGNOSIS — J9621 Acute and chronic respiratory failure with hypoxia: Secondary | ICD-10-CM | POA: Diagnosis present

## 2016-08-25 DIAGNOSIS — Z91018 Allergy to other foods: Secondary | ICD-10-CM

## 2016-08-25 DIAGNOSIS — R748 Abnormal levels of other serum enzymes: Secondary | ICD-10-CM

## 2016-08-25 DIAGNOSIS — I2510 Atherosclerotic heart disease of native coronary artery without angina pectoris: Secondary | ICD-10-CM | POA: Diagnosis not present

## 2016-08-25 DIAGNOSIS — I10 Essential (primary) hypertension: Secondary | ICD-10-CM | POA: Diagnosis present

## 2016-08-25 DIAGNOSIS — K219 Gastro-esophageal reflux disease without esophagitis: Secondary | ICD-10-CM | POA: Diagnosis present

## 2016-08-25 DIAGNOSIS — Z7982 Long term (current) use of aspirin: Secondary | ICD-10-CM

## 2016-08-25 DIAGNOSIS — G9340 Encephalopathy, unspecified: Secondary | ICD-10-CM | POA: Diagnosis not present

## 2016-08-25 DIAGNOSIS — E860 Dehydration: Secondary | ICD-10-CM | POA: Diagnosis present

## 2016-08-25 DIAGNOSIS — I350 Nonrheumatic aortic (valve) stenosis: Secondary | ICD-10-CM

## 2016-08-25 DIAGNOSIS — R7989 Other specified abnormal findings of blood chemistry: Secondary | ICD-10-CM | POA: Diagnosis present

## 2016-08-25 DIAGNOSIS — R112 Nausea with vomiting, unspecified: Secondary | ICD-10-CM | POA: Diagnosis present

## 2016-08-25 DIAGNOSIS — R4182 Altered mental status, unspecified: Secondary | ICD-10-CM

## 2016-08-25 DIAGNOSIS — Z8249 Family history of ischemic heart disease and other diseases of the circulatory system: Secondary | ICD-10-CM

## 2016-08-25 DIAGNOSIS — Z9981 Dependence on supplemental oxygen: Secondary | ICD-10-CM

## 2016-08-25 DIAGNOSIS — E739 Lactose intolerance, unspecified: Secondary | ICD-10-CM | POA: Diagnosis present

## 2016-08-25 DIAGNOSIS — D7589 Other specified diseases of blood and blood-forming organs: Secondary | ICD-10-CM | POA: Diagnosis present

## 2016-08-25 DIAGNOSIS — R040 Epistaxis: Secondary | ICD-10-CM | POA: Diagnosis present

## 2016-08-25 DIAGNOSIS — R778 Other specified abnormalities of plasma proteins: Secondary | ICD-10-CM | POA: Diagnosis present

## 2016-08-25 DIAGNOSIS — J9601 Acute respiratory failure with hypoxia: Secondary | ICD-10-CM

## 2016-08-25 DIAGNOSIS — I110 Hypertensive heart disease with heart failure: Secondary | ICD-10-CM | POA: Diagnosis present

## 2016-08-25 DIAGNOSIS — Z515 Encounter for palliative care: Secondary | ICD-10-CM | POA: Diagnosis not present

## 2016-08-25 HISTORY — DX: Unspecified osteoarthritis, unspecified site: M19.90

## 2016-08-25 HISTORY — DX: Unspecified glaucoma: H40.9

## 2016-08-25 LAB — CBC WITH DIFFERENTIAL/PLATELET
BASOS ABS: 0 10*3/uL (ref 0.0–0.1)
Basophils Relative: 0 %
EOS ABS: 0 10*3/uL (ref 0.0–0.7)
Eosinophils Relative: 0 %
HCT: 32.3 % — ABNORMAL LOW (ref 36.0–46.0)
HEMOGLOBIN: 10.2 g/dL — AB (ref 12.0–15.0)
LYMPHS ABS: 0.9 10*3/uL (ref 0.7–4.0)
LYMPHS PCT: 12 %
MCH: 30.8 pg (ref 26.0–34.0)
MCHC: 31.6 g/dL (ref 30.0–36.0)
MCV: 97.6 fL (ref 78.0–100.0)
Monocytes Absolute: 0.3 10*3/uL (ref 0.1–1.0)
Monocytes Relative: 4 %
NEUTROS PCT: 84 %
Neutro Abs: 6.2 10*3/uL (ref 1.7–7.7)
PLATELETS: 339 10*3/uL (ref 150–400)
RBC: 3.31 MIL/uL — AB (ref 3.87–5.11)
RDW: 15.5 % (ref 11.5–15.5)
WBC: 7.3 10*3/uL (ref 4.0–10.5)

## 2016-08-25 LAB — COMPREHENSIVE METABOLIC PANEL
ALK PHOS: 73 U/L (ref 38–126)
ALT: 10 U/L — AB (ref 14–54)
AST: 25 U/L (ref 15–41)
Albumin: 3.8 g/dL (ref 3.5–5.0)
Anion gap: 11 (ref 5–15)
BUN: 15 mg/dL (ref 6–20)
CALCIUM: 10.4 mg/dL — AB (ref 8.9–10.3)
CHLORIDE: 105 mmol/L (ref 101–111)
CO2: 27 mmol/L (ref 22–32)
Creatinine, Ser: 0.79 mg/dL (ref 0.44–1.00)
GFR calc Af Amer: >60 mL/min (ref >60)
GFR calc non Af Amer: >60 mL/min (ref >60)
Glucose, Bld: 128 mg/dL — ABNORMAL HIGH (ref 65–99)
Potassium: 3.8 mmol/L (ref 3.5–5.1)
SODIUM: 143 mmol/L (ref 135–145)
Total Bilirubin: 0.3 mg/dL (ref 0.3–1.2)
Total Protein: 8 g/dL (ref 6.5–8.1)

## 2016-08-25 LAB — URINALYSIS, ROUTINE W REFLEX MICROSCOPIC
Bilirubin Urine: NEGATIVE
Glucose, UA: NEGATIVE mg/dL
Hgb urine dipstick: NEGATIVE
KETONES UR: NEGATIVE mg/dL
Leukocytes, UA: NEGATIVE
Nitrite: NEGATIVE
PROTEIN: 30 mg/dL — AB
SQUAMOUS EPITHELIAL / LPF: NONE SEEN
Specific Gravity, Urine: 1.019 (ref 1.005–1.030)
pH: 5 (ref 5.0–8.0)

## 2016-08-25 LAB — TROPONIN I: TROPONIN I: 0.03 ng/mL — AB (ref <0.03)

## 2016-08-25 LAB — LIPASE, BLOOD: LIPASE: 16 U/L (ref 11–51)

## 2016-08-25 MED ORDER — IOPAMIDOL (ISOVUE-300) INJECTION 61%
INTRAVENOUS | Status: AC
  Administered 2016-08-26: 75 mL
  Filled 2016-08-25: qty 75

## 2016-08-25 MED ORDER — ACETAMINOPHEN 325 MG PO TABS: 650.0000 mg | ORAL_TABLET | Freq: Four times a day (QID) | ORAL | Status: DC | PRN

## 2016-08-25 MED ORDER — ACETAMINOPHEN 650 MG RE SUPP
650.0000 mg | Freq: Four times a day (QID) | RECTAL | Status: DC | PRN
  Administered 2016-08-29: 650 mg via RECTAL
  Filled 2016-08-25: qty 1

## 2016-08-25 MED ORDER — SODIUM CHLORIDE 0.9% FLUSH
3.0000 mL | Freq: Two times a day (BID) | INTRAVENOUS | Status: DC
  Administered 2016-08-26 – 2016-08-30 (×6): 3 mL via INTRAVENOUS

## 2016-08-25 MED ORDER — FAMOTIDINE IN NACL 20-0.9 MG/50ML-% IV SOLN
20.0000 mg | Freq: Two times a day (BID) | INTRAVENOUS | Status: DC
  Administered 2016-08-25 – 2016-08-26 (×2): 20 mg via INTRAVENOUS
  Filled 2016-08-25 (×2): qty 50

## 2016-08-25 MED ORDER — ONDANSETRON HCL 4 MG/2ML IJ SOLN
1.0000 mg | Freq: Once | INTRAMUSCULAR | Status: AC
  Administered 2016-08-25: 1 mg via INTRAVENOUS
  Filled 2016-08-25: qty 2

## 2016-08-25 MED ORDER — ONDANSETRON HCL 4 MG/2ML IJ SOLN
1.0000 mg | Freq: Three times a day (TID) | INTRAMUSCULAR | Status: DC | PRN
  Administered 2016-08-25: 1 mg via INTRAVENOUS
  Filled 2016-08-25: qty 2

## 2016-08-25 MED ORDER — CLONIDINE HCL 0.1 MG/24HR TD PTWK
0.1000 mg | MEDICATED_PATCH | TRANSDERMAL | Status: DC
  Filled 2016-08-25: qty 1

## 2016-08-25 MED ORDER — ASPIRIN 300 MG RE SUPP
150.0000 mg | Freq: Every day | RECTAL | Status: DC
  Administered 2016-08-26: 150 mg via RECTAL
  Filled 2016-08-25: qty 1

## 2016-08-25 MED ORDER — ALBUTEROL SULFATE (2.5 MG/3ML) 0.083% IN NEBU: 2.5000 mg | INHALATION_SOLUTION | RESPIRATORY_TRACT | Status: DC | PRN

## 2016-08-25 MED ORDER — LATANOPROST 0.005 % OP SOLN
1.0000 [drp] | Freq: Every day | OPHTHALMIC | Status: DC
  Administered 2016-08-26: 1 [drp] via OPHTHALMIC
  Filled 2016-08-25: qty 2.5

## 2016-08-25 MED ORDER — SODIUM CHLORIDE 0.9 % IV BOLUS (SEPSIS)
500.0000 mL | Freq: Once | INTRAVENOUS | Status: AC
  Administered 2016-08-25: 500 mL via INTRAVENOUS

## 2016-08-25 MED ORDER — POLYVINYL ALCOHOL 1.4 % OP SOLN
1.0000 [drp] | Freq: Two times a day (BID) | OPHTHALMIC | Status: DC
  Administered 2016-08-26 – 2016-08-28 (×3): 1 [drp] via OPHTHALMIC
  Filled 2016-08-25 (×2): qty 15

## 2016-08-25 MED ORDER — PROPYLENE GLYCOL 0.6 % OP SOLN: 1.0000 [drp] | Freq: Two times a day (BID) | OPHTHALMIC | Status: DC

## 2016-08-25 MED ORDER — ENOXAPARIN SODIUM 30 MG/0.3ML ~~LOC~~ SOLN: 30.0000 mg | SUBCUTANEOUS | Status: DC

## 2016-08-25 MED ORDER — HYDRALAZINE HCL 20 MG/ML IJ SOLN: 5.0000 mg | INTRAMUSCULAR | Status: DC | PRN

## 2016-08-25 MED ORDER — SODIUM CHLORIDE 0.9 % IV SOLN
Freq: Once | INTRAVENOUS | Status: AC
  Administered 2016-08-25: 22:00:00 via INTRAVENOUS

## 2016-08-25 NOTE — ED Provider Notes (Signed)
[facility]-EMERGENCY DEPT Provider Note   CSN: [id number] Arrival date & time: 09-23-2016  1749     History   Chief Complaint Chief Complaint  Patient presents with  . Altered Mental Status  . Emesis    HPI Brittany [name] is a 83 y.o. female.  Level V caveat for altered mental status. Patient lives at home with her daughter and granddaughter. Family reports increased lethargy and confusion today with 6-7 episodes of vomiting and a low-grade fever. Patient is normally alert.      Past Medical History:  Diagnosis Date  . Bilateral edema of lower extremity    intermittent   . CAD (coronary artery disease)   . CHF (congestive heart failure) (HCC)   . GERD (gastroesophageal reflux disease)   . History of Doppler ultrasound 09/12/2010   carotid doppler; bilat blb/proximal ICAs demonstrated mild amount of fibrous plaque; mildly abnormal study   . History of echocardiogram 03/22/2009   EF >55%; normal LV size & systolic function; normal RV size & systolic function; mild aortic stenosis; mild MR; mild TR  . History of nuclear stress test 12/23/2008   normal perfusion to all regions; negative for ischemia; low risk scan; Dipyridamole   . Hyperlipemia   . Hypertension   . MI (myocardial infarction)   . Mild aortic valve stenosis   . Ventricular arrhythmia     Patient Active Problem List   Diagnosis Date Noted  . Acute encephalopathy 23-Sep-2016  . Renal insufficiency 09/28/2015  . Anemia 09/28/2015  . Old MI (myocardial infarction) 07/06/2013  . CAD in native artery 12/12/2012  . Essential hypertension 12/12/2012  . Hyperlipidemia with target LDL less than 70 12/12/2012  . Mild aortic stenosis by prior echocardiogram 12/12/2012    Past Surgical History:  Procedure Laterality Date  . CARDIAC CATHETERIZATION  05/22/1998   PTCA to completely occluded LAD at bifurcation of LAD & diagonal vessel (r/t VT/VF arrest);   Marland Kitchen CATARACT EXTRACTION  2008   with intraocular lens  implantation  . GLAUCOMA SURGERY      OB History    No data available       Home Medications    Prior to Admission medications   Medication Sig Start Date End Date Taking? Authorizing Provider  acetaminophen (TYLENOL) 650 MG CR tablet Take 1,300 mg by mouth 2 (two) times daily.   Yes Historical Provider, MD  amLODipine (NORVASC) 10 MG tablet Take 10 mg by mouth daily.    Yes Historical Provider, MD  aspirin EC 81 MG tablet Take 81 mg by mouth daily.   Yes Historical Provider, MD  bimatoprost (LUMIGAN) 0.01 % SOLN Place 1 drop into both eyes at bedtime.   Yes Historical Provider, MD  cloNIDine (CATAPRES) 0.2 MG tablet Take 0.2 mg by mouth 2 (two) times daily. 07/17/11  Yes Gaspar Garbe, MD  furosemide (LASIX) 40 MG tablet Take 40 mg by mouth daily.   Yes Historical Provider, MD  isosorbide mononitrate (IMDUR) 60 MG 24 hr tablet Take 60 mg by mouth daily.   Yes Historical Provider, MD  KRILL OIL OMEGA-3 PO Take 1 capsule by mouth daily.   Yes Historical Provider, MD  Multiple Vitamin (MULITIVITAMIN WITH MINERALS) TABS Take 1 tablet by mouth daily.   Yes Historical Provider, MD  omeprazole (PRILOSEC) 20 MG capsule Take 20 mg by mouth 2 (two) times daily.   Yes Historical Provider, MD  Propylene Glycol (SYSTANE BALANCE) 0.6 % SOLN Place 1-2 drops into both eyes 2 (  two) times daily.   Yes Historical Provider, MD  rosuvastatin (CRESTOR) 10 MG tablet Take 10 mg by mouth daily.   Yes Historical Provider, MD  fentaNYL (DURAGESIC - DOSED MCG/HR) 50 MCG/HR Place 1 patch onto the skin every 3 (three) days.    Historical Provider, MD    Family History Family History  Problem Relation Age of Onset  . Stroke Mother   . Cancer Sister   . Heart attack Sister   . Cancer Child   . Diabetes Child     Social History Social History  Substance Use Topics  . Smoking status: Never Smoker  . Smokeless tobacco: Never Used  . Alcohol use No     Allergies   Lactose intolerance (gi) and  Pineapple   Review of Systems Review of Systems  Reason unable to perform ROS: Altered mental status.     Physical Exam Updated Vital Signs BP (!) 163/68   Pulse 77   Temp 98.9 F (37.2 C) (Oral)   Resp 15   SpO2 100%   Physical Exam  Constitutional:  Patient is hard of hearing, answers questions reasonably well.  HENT:  Head: Normocephalic and atraumatic.  Eyes: Conjunctivae are normal.  Neck: Neck supple.  Cardiovascular: Normal rate and regular rhythm.   Pulmonary/Chest: Effort normal and breath sounds normal.  Abdominal: Soft. Bowel sounds are normal.  Musculoskeletal:  Hands are flexed and contracted consistent with rheumatoid arthritis  Neurological: She is alert.  Alert and oriented 2, did not know day.  Skin: Skin is warm and dry.  Psychiatric:  Family reports increased confusion  Nursing note and vitals reviewed.    ED Treatments / Results  Labs (all labs ordered are listed, but only abnormal results are displayed) Labs Reviewed  CBC WITH DIFFERENTIAL/PLATELET - Abnormal; Notable for the following:       Result Value   RBC 3.31 (*)    Hemoglobin 10.2 (*)    HCT 32.3 (*)    All other components within normal limits  COMPREHENSIVE METABOLIC PANEL - Abnormal; Notable for the following:    Glucose, Bld 128 (*)    Calcium 10.4 (*)    ALT 10 (*)    All other components within normal limits  TROPONIN I - Abnormal; Notable for the following:    Troponin I 0.03 (*)    All other components within normal limits  URINE CULTURE  LIPASE, BLOOD  URINALYSIS, ROUTINE W REFLEX MICROSCOPIC    EKG  EKG Interpretation None       Date: 08/06/2016  Rate: 99   Rhythm: normal sinus rhythm  QRS Axis: normal  Intervals: normal  ST/T Wave abnormalities: normal  Conduction Disutrbances: none  Narrative Interpretation: unremarkable    Radiology Ct Head Wo Contrast  Result Date: 08/26/2016 CLINICAL DATA:  Altered mental status EXAM: CT HEAD WITHOUT  CONTRAST TECHNIQUE: Contiguous axial images were obtained from the base of the skull through the vertex without intravenous contrast. COMPARISON:  07/14/2011 FINDINGS: Brain: Suboptimal study due to motion artifacts. No intracranial hemorrhage, mass effect or midline shift. No acute cortical infarction. Stable atrophy and chronic white matter disease. Stable small calcified meningioma in left frontal frontal skull measures about 7 mm. Vascular: Atherosclerotic calcifications of carotid siphon and vertebral arteries again noted. Skull: No skull fracture is noted. Sinuses/Orbits: Mild mucosal thickening with partial opacification right posterior ethmoid air cells. Other: None IMPRESSION: No acute intracranial abnormality. Stable atrophy and chronic white matter disease. No definite acute cortical infarction.  There are motion artifacts limiting the examination. No definite acute cortical infarction. Electronically Signed   By: Natasha Mead M.D.   On: 08-29-16 19:38   Dg Chest Port 1 View  Result Date: 08/29/16 CLINICAL DATA:  Altered mental status. EXAM: PORTABLE CHEST 1 VIEW COMPARISON:  Radiograph of July 14, 2011. FINDINGS: Stable cardiomediastinal silhouette. Stable central pulmonary vascular congestion. No pneumothorax or pleural effusion is noted. Bony thorax is unremarkable. IMPRESSION: Stable central pulmonary vascular congestion. Electronically Signed   By: Lupita Raider, M.D.   On: August 29, 2016 19:00    Procedures Procedures (including critical care time)  Medications Ordered in ED Medications  0.9 %  sodium chloride infusion (not administered)  sodium chloride 0.9 % bolus 500 mL (0 mLs Intravenous Stopped 08/29/16 2202)  ondansetron (ZOFRAN) injection 1 mg (1 mg Intravenous Given 08-29-2016 1945)     Initial Impression / Assessment and Plan / ED Course  I have reviewed the triage vital signs and the nursing notes.  Pertinent labs & imaging results that were available during my care of  the patient were reviewed by me and considered in my medical decision making (see chart for details).     Family reports increased confusion. Laboratory evaluation, CT head, chest x-ray showed no acute findings. She may have had a small stroke or exposure to viral illness. Admit to general medicine.  Final Clinical Impressions(s) / ED Diagnoses   Final diagnoses:  Altered mental status, unspecified altered mental status type    New Prescriptions New Prescriptions   No medications on file     Donnetta Hutching, MD 29-Aug-2016 2225

## 2016-08-25 NOTE — ED Notes (Signed)
0.03 trop

## 2016-08-25 NOTE — ED Triage Notes (Addendum)
Patient from home and has been more lethargic and confused today. Last seen normal was last night around 9PM when she went to bed. SHe has been vomiting today, around 6-7 times. Emesis upon arrival, liquid, and brown. A/O x2 normally A/O x4 at baseline. O2 sats are in the 80's on room air and doesn't wear O2 at home normally

## 2016-08-25 NOTE — H&P (Addendum)
History and Physical    Brittany [name] Brittany [name] DOB: [birth year] DOA: 08/24/2016  Referring MD/NP/PA:   PCP: Gaspar Garbe, MD   Patient coming from:  The patient is coming from home.  At baseline, pt is dependent for most of ADL.   Chief Complaint: AMS, nausea, vomiting  HPI: Brittany [name] is a 83 y.o. female with medical history significant of hypertension, hyperlipidemia, GERD, aortic stenosis, ventricular arrhythmia, CAD, dCHF , who presents with altered mental status, nausea and vomiting.  Per patient's daughter and granddaughter who is a Publishing rights manager, patient became confused since last night. She is more lethargic and sleepy. She has decreased oral intake. She has nausea, and vomited 4-6 of times without blood in the vomitus. Does not seem to have abdominal pain per her granddaughter, but pt seems to have abdominal tenderness on my examination. No diarrhea noted. Patient has low-grade fever with temperature 99.3 last night. Patient moves all extremities normally, no unilateral weakness, facial droop or slurred speech. Per her granddaughter, pt does not seem to have chest pain, SOB, cough. She uses oxygen 2 L at night at home. Not sure if patient has symptoms of UTI. Per her granddaughter, patient never had typical chest pain when she had myocardial infarction in the past. She had nausea, vomiting and diarrhea when she had myocardial infarction in the past.  ED Course: pt was found to have WBC 7.3, positive troponin 0.03, lipase is 16, electrolytes renal function okay, pending urinalysis. Chest x-ray showed stable pulmonary congestion, negative CT head for acute intracranial abnormalities. Temperature normal, no tachycardia, patient had A desaturation to high 80s in ED, which improved to above 90s on 2 L oxygen. Patient is placed on telemetry bed for observation.  Review of Systems: Could not be reviewed with due to altered mental status.  Allergy:  Allergies    Allergen Reactions  . Lactose Intolerance (Gi) Nausea And Vomiting  . Pineapple Swelling    Swelling of the lips    Past Medical History:  Diagnosis Date  . Bilateral edema of lower extremity    intermittent   . CAD (coronary artery disease)   . CHF (congestive heart failure) (HCC)   . GERD (gastroesophageal reflux disease)   . History of Doppler ultrasound 09/12/2010   carotid doppler; bilat blb/proximal ICAs demonstrated mild amount of fibrous plaque; mildly abnormal study   . History of echocardiogram 03/22/2009   EF >55%; normal LV size & systolic function; normal RV size & systolic function; mild aortic stenosis; mild MR; mild TR  . History of nuclear stress test 12/23/2008   normal perfusion to all regions; negative for ischemia; low risk scan; Dipyridamole   . Hyperlipemia   . Hypertension   . MI (myocardial infarction)   . Mild aortic valve stenosis   . Ventricular arrhythmia     Past Surgical History:  Procedure Laterality Date  . CARDIAC CATHETERIZATION  05/22/1998   PTCA to completely occluded LAD at bifurcation of LAD & diagonal vessel (r/t VT/VF arrest);   Marland Kitchen CATARACT EXTRACTION  2008   with intraocular lens implantation  . GLAUCOMA SURGERY      Social History:  reports that she has never smoked. She has never used smokeless tobacco. She reports that she does not drink alcohol or use drugs.  Family History:  Family History  Problem Relation Age of Onset  . Stroke Mother   . Cancer Sister   . Heart attack Sister   . Cancer Child   .  Diabetes Child      Prior to Admission medications   Medication Sig Start Date End Date Taking? Authorizing Provider  acetaminophen (TYLENOL) 650 MG CR tablet Take 1,300 mg by mouth 2 (two) times daily.   Yes Historical Provider, MD  amLODipine (NORVASC) 10 MG tablet Take 10 mg by mouth daily.    Yes Historical Provider, MD  aspirin EC 81 MG tablet Take 81 mg by mouth daily.   Yes Historical Provider, MD  bimatoprost  (LUMIGAN) 0.01 % SOLN Place 1 drop into both eyes at bedtime.   Yes Historical Provider, MD  cloNIDine (CATAPRES) 0.2 MG tablet Take 0.2 mg by mouth 2 (two) times daily. 07/17/11  Yes Gaspar Garbe, MD  furosemide (LASIX) 40 MG tablet Take 40 mg by mouth daily.   Yes Historical Provider, MD  isosorbide mononitrate (IMDUR) 60 MG 24 hr tablet Take 60 mg by mouth daily.   Yes Historical Provider, MD  KRILL OIL OMEGA-3 PO Take 1 capsule by mouth daily.   Yes Historical Provider, MD  Multiple Vitamin (MULITIVITAMIN WITH MINERALS) TABS Take 1 tablet by mouth daily.   Yes Historical Provider, MD  omeprazole (PRILOSEC) 20 MG capsule Take 20 mg by mouth 2 (two) times daily.   Yes Historical Provider, MD  Propylene Glycol (SYSTANE BALANCE) 0.6 % SOLN Place 1-2 drops into both eyes 2 (two) times daily.   Yes Historical Provider, MD  rosuvastatin (CRESTOR) 10 MG tablet Take 10 mg by mouth daily.   Yes Historical Provider, MD  fentaNYL (DURAGESIC - DOSED MCG/HR) 50 MCG/HR Place 1 patch onto the skin every 3 (three) days.    Historical Provider, MD    Physical Exam: Vitals:   Sep 03, 2016 2130 09-03-2016 2145 09/03/2016 2200 2016/09/03 2215  BP: 122/69 129/64 125/71 132/65  Pulse: 82 86 84 90  Resp: 11 10 11 17   Temp:      TempSrc:      SpO2: 98% 98% (!) 86% 95%   General: Not in acute distress HEENT:       Eyes: PERRL, EOMI, no scleral icterus.       ENT: No discharge from the ears and nose, no pharynx injection, no tonsillar enlargement.        Neck: No JVD, no bruit, no mass felt. Heme: No neck lymph node enlargement. Cardiac: S1/S2, RRR, 2/6 systolic murmurs, No gallops or rubs. Respiratory: has rhonchi bilaterally. No rales or rubs. GI: Soft, nondistended, nontender, no rebound pain, no organomegaly, BS present. GU: No hematuria Ext: No pitting leg edema bilaterally. 2+DP/PT pulse bilaterally. Musculoskeletal: No joint deformities, No joint redness or warmth, no limitation of ROM in spin. Skin: No  rashes.  Neuro: confused, not oriented X3, cranial nerves II-XII grossly intact, moves all extremities. Psych: Patient is not psychotic, no suicidal or hemocidal ideation.  Labs on Admission: I have personally reviewed following labs and imaging studies  CBC:  Recent Labs Lab 2016/09/03 1931  WBC 7.3  NEUTROABS 6.2  HGB 10.2*  HCT 32.3*  MCV 97.6  PLT 339   Basic Metabolic Panel:  Recent Labs Lab 2016-09-03 1931  NA 143  K 3.8  CL 105  CO2 27  GLUCOSE 128*  BUN 15  CREATININE 0.79  CALCIUM 10.4*   GFR: Estimated Creatinine Clearance: 24.8 mL/min (by C-G formula based on SCr of 0.79 mg/dL). Liver Function Tests:  Recent Labs Lab Sep 03, 2016 1931  AST 25  ALT 10*  ALKPHOS 73  BILITOT 0.3  PROT 8.0  ALBUMIN 3.8    Recent Labs Lab 08/12/2016 1931  LIPASE 16   No results for input(s): AMMONIA in the last 168 hours. Coagulation Profile: No results for input(s): INR, PROTIME in the last 168 hours. Cardiac Enzymes:  Recent Labs Lab 08/14/2016 1931  TROPONINI 0.03*   BNP (last 3 results) No results for input(s): PROBNP in the last 8760 hours. HbA1C: No results for input(s): HGBA1C in the last 72 hours. CBG: No results for input(s): GLUCAP in the last 168 hours. Lipid Profile: No results for input(s): CHOL, HDL, LDLCALC, TRIG, CHOLHDL, LDLDIRECT in the last 72 hours. Thyroid Function Tests: No results for input(s): TSH, T4TOTAL, FREET4, T3FREE, THYROIDAB in the last 72 hours. Anemia Panel: No results for input(s): VITAMINB12, FOLATE, FERRITIN, TIBC, IRON, RETICCTPCT in the last 72 hours. Urine analysis:    Component Value Date/Time   COLORURINE YELLOW 07/14/2011 1301   APPEARANCEUR CLEAR 07/14/2011 1301   LABSPEC 1.022 07/14/2011 1301   PHURINE 6.0 07/14/2011 1301   GLUCOSEU NEGATIVE 07/14/2011 1301   HGBUR NEGATIVE 07/14/2011 1301   BILIRUBINUR NEGATIVE 07/14/2011 1301   KETONESUR NEGATIVE 07/14/2011 1301   PROTEINUR 30 (A) 07/14/2011 1301    UROBILINOGEN 0.2 07/14/2011 1301   NITRITE NEGATIVE 07/14/2011 1301   LEUKOCYTESUR NEGATIVE 07/14/2011 1301   Sepsis Labs: @LABRCNTIP (procalcitonin:4,lacticidven:4) )No results found for this or any previous visit (from the past 240 hour(s)).   Radiological Exams on Admission: Ct Head Wo Contrast  Result Date: 08/29/2016 CLINICAL DATA:  Altered mental status EXAM: CT HEAD WITHOUT CONTRAST TECHNIQUE: Contiguous axial images were obtained from the base of the skull through the vertex without intravenous contrast. COMPARISON:  07/14/2011 FINDINGS: Brain: Suboptimal study due to motion artifacts. No intracranial hemorrhage, mass effect or midline shift. No acute cortical infarction. Stable atrophy and chronic white matter disease. Stable small calcified meningioma in left frontal frontal skull measures about 7 mm. Vascular: Atherosclerotic calcifications of carotid siphon and vertebral arteries again noted. Skull: No skull fracture is noted. Sinuses/Orbits: Mild mucosal thickening with partial opacification right posterior ethmoid air cells. Other: None IMPRESSION: No acute intracranial abnormality. Stable atrophy and chronic white matter disease. No definite acute cortical infarction. There are motion artifacts limiting the examination. No definite acute cortical infarction. Electronically Signed   By: Natasha Mead M.D.   On: 08/21/2016 19:38   Dg Chest Port 1 View  Result Date: 08/06/2016 CLINICAL DATA:  Altered mental status. EXAM: PORTABLE CHEST 1 VIEW COMPARISON:  Radiograph of July 14, 2011. FINDINGS: Stable cardiomediastinal silhouette. Stable central pulmonary vascular congestion. No pneumothorax or pleural effusion is noted. Bony thorax is unremarkable. IMPRESSION: Stable central pulmonary vascular congestion. Electronically Signed   By: Lupita Raider, M.D.   On: 08/15/2016 19:00     EKG: Independently reviewed.  Sinus rhythm,, QTC 466, LVH, old incomplete left bundle blockage, T-wave  inversion in lead I/aVL.    Assessment/Plan Principal Problem:   Acute encephalopathy Active Problems:   CAD in native artery   Essential hypertension   Hyperlipidemia with target LDL less than 70   Mild aortic stenosis by prior echocardiogram   Nausea & vomiting   Chronic respiratory failure with hypoxia (HCC)   Elevated troponin   Hypercalcemia   Chronic diastolic CHF (congestive heart failure) (HCC)   Addendum:  hgb dropped from 10.2 to 9.0 in AM. -will get FOBT -check CBC again at 8:00 AM -INR, PTT and type screen  Acute encephalopathy: Etiology is not clear, differential diagnoses include ACS given positive  troponin, UTI. Patient moves all extremities, less likely to have stroke. Per her granddaughter who is a Publishing rights managernurse practitioner, MRI is not necessary at this time.   -will place on tele bed for obs -Frequent neurologic checks -hold oral meds and NPO now -f/u UTI and urine culture -r/o ACS as below  Hx of CAD/MI and positive trop:  -ASA per rectal -hold oral crestor due to AMS - cycle CE q6 x3 and repeat EKG in the am  - Risk factor stratification: will check FLP and A1C  - 2d echo  Addendum: trop is trending up slingly 0.03-->0.04. And d-dimer is positive 2.24, indicating patient may have NSTEMI and possible PE. Since pt just received IV contrast for abdominal CT, we'll not get CT angiogram now -will start IV heparin now. This was discussed with her granddaughter who is a Publishing rights managernurse practitioner. -will consider either to get V/Q scan or wait for 24 hours and get CTA if her renal fx is fine then.  Chronic respiratory failure with hypoxia: pt had oxygen desaturation to 80s in ED. This is likely due to chronic respiratory failure since patient is using oxygen 2 L in the night at home. Due to the old age, pt has miminal movement, which increases risk of getting clogged, we need to rule out PE. -Nasal cannula oxygen to maintain oxygen saturation above 90% -Check d-dimer. If  positive-->will get CTA to r/o PE  Nausea and vomiting: Etiology is not clear. Lipase is normal. Patient seems to have abdominal tenderness on examination, we need to rule out acute abdomen -will get  CT-abd/pelbis -prn zofran for nausea    HTN: -hold oral med -switch clonidine to clonidine patch -IV hydralazine when necessary  HLD: Last LDL was not on record -Hold oral crestor due to AMS -Check FLP  Mild hypercalcemia: Ca is 10.4, likely due to dehydration -IV fluid, and patient received a 500 mL normal saline  Chronic diastolic congestive heart failure: 2-D echo on 10/14/15 showed EF of 55-60% with grade 1 diastolic dysfunction. Patient does not have leg edema JVD. CHF is compensated. -Hold Lasix while patient is on by mouth -Check BNP  GERD: -Pepcid IV   DVT ppx: SQ Lovenox Code Status: DNR Family Communication: Yes, patient's daughter and granddaughter at bed side Disposition Plan:  Anticipate discharge back to previous home environment Consults called:  none Admission status: Obs / tele       Date of Service 08/03/2016    Lorretta HarpIU, Beatric Fulop [facility] Pager [phone]  If 7PM-7AM, please contact night-coverage [email or web address] Password [email or web address] 08/29/2016, 11:09 PM

## 2016-08-25 NOTE — ED Notes (Signed)
Dr Cook at bedside

## 2016-08-25 NOTE — ED Notes (Signed)
Delay in lab draw pt bleeding from nose, MD in room.

## 2016-08-26 ENCOUNTER — Inpatient Hospital Stay (HOSPITAL_BASED_OUTPATIENT_CLINIC_OR_DEPARTMENT_OTHER): Payer: Medicare Other

## 2016-08-26 ENCOUNTER — Inpatient Hospital Stay (HOSPITAL_COMMUNITY): Payer: Medicare Other

## 2016-08-26 ENCOUNTER — Encounter (HOSPITAL_COMMUNITY): Payer: Self-pay

## 2016-08-26 DIAGNOSIS — R072 Precordial pain: Secondary | ICD-10-CM | POA: Diagnosis not present

## 2016-08-26 DIAGNOSIS — G9340 Encephalopathy, unspecified: Secondary | ICD-10-CM | POA: Diagnosis not present

## 2016-08-26 LAB — ECHOCARDIOGRAM COMPLETE
HEIGHTINCHES: 60 in
WEIGHTICAEL: 1809.54 [oz_av]

## 2016-08-26 LAB — TROPONIN I
TROPONIN I: 0.05 ng/mL — AB (ref <0.03)
TROPONIN I: 0.05 ng/mL — AB (ref <0.03)
Troponin I: 0.04 ng/mL (ref <0.03)

## 2016-08-26 LAB — CBC
HCT: 29.7 % — ABNORMAL LOW (ref 36.0–46.0)
HCT: 31.2 % — ABNORMAL LOW (ref 36.0–46.0)
HEMOGLOBIN: 9.4 g/dL — AB (ref 12.0–15.0)
Hemoglobin: 9 g/dL — ABNORMAL LOW (ref 12.0–15.0)
MCH: 30.7 pg (ref 26.0–34.0)
MCH: 31 pg (ref 26.0–34.0)
MCHC: 30.3 g/dL (ref 30.0–36.0)
MCHC: 30.1 g/dL (ref 30.0–36.0)
MCV: 101.4 fL — AB (ref 78.0–100.0)
MCV: 103 fL — AB (ref 78.0–100.0)
PLATELETS: 287 10*3/uL (ref 150–400)
PLATELETS: 280 10*3/uL (ref 150–400)
RBC: 2.93 MIL/uL — ABNORMAL LOW (ref 3.87–5.11)
RBC: 3.03 MIL/uL — AB (ref 3.87–5.11)
RDW: 15.9 % — AB (ref 11.5–15.5)
RDW: 16.5 % — ABNORMAL HIGH (ref 11.5–15.5)
WBC: 12.6 10*3/uL — ABNORMAL HIGH (ref 4.0–10.5)
WBC: 13.5 10*3/uL — ABNORMAL HIGH (ref 4.0–10.5)

## 2016-08-26 LAB — BASIC METABOLIC PANEL
Anion gap: 9 (ref 5–15)
BUN: 18 mg/dL (ref 6–20)
CALCIUM: 9.3 mg/dL (ref 8.9–10.3)
CHLORIDE: 107 mmol/L (ref 101–111)
CO2: 27 mmol/L (ref 22–32)
CREATININE: 0.84 mg/dL (ref 0.44–1.00)
GFR calc Af Amer: >60 mL/min (ref >60)
GFR calc non Af Amer: 54 mL/min — ABNORMAL LOW (ref >60)
Glucose, Bld: 109 mg/dL — ABNORMAL HIGH (ref 65–99)
Potassium: 5.1 mmol/L (ref 3.5–5.1)
SODIUM: 143 mmol/L (ref 135–145)

## 2016-08-26 LAB — D-DIMER, QUANTITATIVE (NOT AT ARMC): D DIMER QUANT: 2.24 ug{FEU}/mL — AB (ref 0.00–0.50)

## 2016-08-26 LAB — TYPE AND SCREEN
ABO/RH(D): O POS
ANTIBODY SCREEN: NEGATIVE

## 2016-08-26 LAB — LIPID PANEL
CHOL/HDL RATIO: 2.2 ratio
Cholesterol: 155 mg/dL (ref 0–200)
HDL: 71 mg/dL (ref >40)
LDL Cholesterol: 76 mg/dL (ref 0–99)
Triglycerides: 39 mg/dL (ref <150)
VLDL: 8 mg/dL (ref 0–40)

## 2016-08-26 LAB — MRSA PCR SCREENING: MRSA by PCR: NEGATIVE

## 2016-08-26 LAB — HEPARIN LEVEL (UNFRACTIONATED): Heparin Unfractionated: 1.04 IU/mL — ABNORMAL HIGH (ref 0.30–0.70)

## 2016-08-26 LAB — APTT: aPTT: >200 seconds (ref 24–36)

## 2016-08-26 LAB — PROTIME-INR
INR: 1.22
PROTHROMBIN TIME: 15.4 s — AB (ref 11.4–15.2)

## 2016-08-26 LAB — GLUCOSE, CAPILLARY: GLUCOSE-CAPILLARY: 93 mg/dL (ref 65–99)

## 2016-08-26 LAB — BRAIN NATRIURETIC PEPTIDE: B Natriuretic Peptide: 232.3 pg/mL — ABNORMAL HIGH (ref 0.0–100.0)

## 2016-08-26 MED ORDER — FAMOTIDINE IN NACL 20-0.9 MG/50ML-% IV SOLN
20.0000 mg | INTRAVENOUS | Status: DC
Start: 2016-08-27 — End: 2016-08-27

## 2016-08-26 MED ORDER — HEPARIN BOLUS VIA INFUSION
2000.0000 [IU] | Freq: Once | INTRAVENOUS | Status: AC
  Administered 2016-08-26: 2000 [IU] via INTRAVENOUS
  Filled 2016-08-26: qty 2000

## 2016-08-26 MED ORDER — HEPARIN (PORCINE) IN NACL 100-0.45 UNIT/ML-% IJ SOLN
400.0000 [IU]/h | INTRAMUSCULAR | Status: DC
  Administered 2016-08-26: 800 [IU]/h via INTRAVENOUS
  Filled 2016-08-26: qty 250

## 2016-08-26 MED ORDER — LORAZEPAM 2 MG/ML IJ SOLN
1.0000 mg | INTRAMUSCULAR | Status: DC | PRN
  Administered 2016-08-26 – 2016-08-27 (×3): 1 mg via INTRAVENOUS
  Filled 2016-08-26 (×3): qty 1

## 2016-08-26 MED ORDER — MORPHINE SULFATE (PF) 2 MG/ML IV SOLN
1.0000 mg | INTRAVENOUS | Status: DC | PRN
  Administered 2016-08-26 (×2): 2 mg via INTRAVENOUS
  Administered 2016-08-26: 1 mg via INTRAVENOUS
  Administered 2016-08-27 (×2): 2 mg via INTRAVENOUS
  Filled 2016-08-26 (×5): qty 1

## 2016-08-26 MED ORDER — SODIUM CHLORIDE 0.9 % IV SOLN
Freq: Once | INTRAVENOUS | Status: AC
  Administered 2016-08-26: 15:00:00 via INTRAVENOUS

## 2016-08-26 NOTE — Progress Notes (Signed)
  Echocardiogram 2D Echocardiogram has been performed.  Janalyn HarderWest, Rayfield Beem R 08/26/2016, 3:01 PM

## 2016-08-26 NOTE — Progress Notes (Signed)
Per MD Dr. Alvino Chapelhoi, okay for patient to go to CT angio of chest without RN.

## 2016-08-26 NOTE — ED Notes (Signed)
Pt's level of care changed to stepdown status.

## 2016-08-26 NOTE — Progress Notes (Signed)
Paged Dr. Alvino Chapelhoi about patient increase secretions and frank red color. Nose bleed noted as well. MD aware. Orders to stop heparin. Humidifier attached to nasal cannula. Sats 99% on 4L. Will continue to monitor closely.

## 2016-08-26 NOTE — Progress Notes (Signed)
IV team could not get another IV for CT angio, Paged Dr. Alvino Chapelhoi and made aware.

## 2016-08-26 NOTE — Care Management Obs Status (Signed)
MEDICARE OBSERVATION STATUS NOTIFICATION   Patient Details  Name: Brittany [name] MRN: [id number] Date of Birth: 09/10/1910   Medicare Observation Status Notification Given:  Yes    Kimani Hovis M, RN 08/26/2016, 5:09 PM 

## 2016-08-26 NOTE — Progress Notes (Signed)
PROGRESS NOTE    Brittany [name]  ZOX:[id number] DOB: 11/11/1910 DOA: September 05, 2016 PCP: Gaspar Garbe, MD     Brief Narrative:  Brittany [name] is a 83 y.o. female with medical history significant of hypertension, hyperlipidemia, GERD, aortic stenosis, ventricular arrhythmia, CAD, dCHF, who presents with altered mental status, nausea and vomiting. Per patient's daughter and granddaughter who is a Publishing rights manager, patient became confused since last night. She is more lethargic and sleepy. She has decreased oral intake. She has nausea, and vomited 4-6 of times without blood in the vomitus. No diarrhea noted. Patient has low-grade fever with temperature 99.3 last night. Patient moves all extremities normally, no unilateral weakness, facial droop or slurred speech. Per her granddaughter, pt does not seem to have chest pain, SOB, cough. She uses oxygen 2 L at night at home. Per her granddaughter, patient never had typical chest pain when she had myocardial infarction in the past. She had nausea, vomiting and diarrhea when she had myocardial infarction in the past. Workup revealed positive d-dimer, patient was started on heparin drip and admitted to stepdown unit.  Assessment & Plan:   Principal Problem:   Acute encephalopathy Active Problems:   CAD in native artery   Essential hypertension   Hyperlipidemia with target LDL less than 70   Mild aortic stenosis by prior echocardiogram   Nausea & vomiting   Chronic respiratory failure with hypoxia (HCC)   Elevated troponin   Hypercalcemia   Chronic diastolic CHF (congestive heart failure) (HCC)  Acute encephalopathy -Etiology unclear -CT head unremarkable -Urinalysis unremarkable, urine culture is pending -CXR unremarkable  -Continue to monitor, patient at baseline is quite functional for her age and is able to have a conversation, ambulate, feed herself  Acute on chronic hypoxemic respiratory failure -Requires 2L at nighttime  at home, but desatted to the 80s in the ED -Positive D-dimer, r/o PE with CTA chest in light of stable Cr function  Nausea and vomiting -Etiology unclear, CT abd/pelvis unremarkable  CAD -Continue aspirin  -Troponin very minimally elevated  HTN -Clonidine switched to patch in light of somnolence   HLD -Holding crestor in light of somnolence   Chronic diastolic congestive heart failure -2-D echo on 10/14/15 showed EF of 55-60% with grade 1 diastolic dysfunction -Without acute exacerbation   GERD -Pepcid    DVT prophylaxis: heparin gtt Code Status: DNR, confirmed with granddaughter/medical decision maker Family Communication: granddaughter over the phone today Disposition Plan: pending work up    Consultants:   None  Procedures:   None  Antimicrobials:   None    Subjective: Patient is quite somnolent on exam. She does not awaken with verbal or physical stimuli, snoring while sleeping. Appears comfortable  Objective: Vitals:   08/26/16 0743 08/26/16 0800 08/26/16 0900 08/26/16 1000  BP:  (!) 110/42 (!) 110/50 136/85  Pulse:  71 72 81  Resp:      Temp: 97.9 F (36.6 C)     TempSrc: Oral     SpO2:  99% 100% 100%  Weight:      Height:        Intake/Output Summary (Last 24 hours) at 08/26/16 1032 Last data filed at 08/26/16 1000  Gross per 24 hour  Intake           660.93 ml  Output                0 ml  Net           660.93  ml   Filed Weights   08/26/16 0100 08/26/16 0300  Weight: 51.3 kg (113 lb 1.5 oz) 51.3 kg (113 lb 1.5 oz)    Examination:  General exam: Appears calm and comfortable, Somnolent, sleeping, snoring Respiratory system: Clear to auscultation. Respiratory effort normal. On nasal cannula oxygen Cardiovascular system: S1 & S2 heard, RRR. No JVD, murmurs, rubs, gallops or clicks. No pedal edema. Gastrointestinal system: Abdomen is nondistended, soft and nontender. No organomegaly or masses felt. Normal bowel sounds heard. Central  nervous system: Unable to test as patient somnolent on exam Extremities: Symmetric Skin: No rashes, lesions or ulcers on exposed skin   Data Reviewed: I have personally reviewed following labs and imaging studies  CBC:  Recent Labs Lab 2016/07/11 1931 08/26/16 0303 08/26/16 0652  WBC 7.3 12.6* 13.5*  NEUTROABS 6.2  --   --   HGB 10.2* 9.0* 9.4*  HCT 32.3* 29.7* 31.2*  MCV 97.6 101.4* 103.0*  PLT 339 287 280   Basic Metabolic Panel:  Recent Labs Lab 2016/07/11 1931 08/26/16 0303  NA 143 143  K 3.8 5.1  CL 105 107  CO2 27 27  GLUCOSE 128* 109*  BUN 15 18  CREATININE 0.79 0.84  CALCIUM 10.4* 9.3   GFR: Estimated Creatinine Clearance: 23.7 mL/min (by C-G formula based on SCr of 0.84 mg/dL). Liver Function Tests:  Recent Labs Lab 2016/07/11 1931  AST 25  ALT 10*  ALKPHOS 73  BILITOT 0.3  PROT 8.0  ALBUMIN 3.8    Recent Labs Lab 2016/07/11 1931  LIPASE 16   No results for input(s): AMMONIA in the last 168 hours. Coagulation Profile: No results for input(s): INR, PROTIME in the last 168 hours. Cardiac Enzymes:  Recent Labs Lab 2016/07/11 1931 2016/07/11 2346 08/26/16 0303  TROPONINI 0.03* 0.04* 0.05*   BNP (last 3 results) No results for input(s): PROBNP in the last 8760 hours. HbA1C: No results for input(s): HGBA1C in the last 72 hours. CBG:  Recent Labs Lab 08/26/16 0745  GLUCAP 93   Lipid Profile:  Recent Labs  08/26/16 0303  CHOL 155  HDL 71  LDLCALC 76  TRIG 39  CHOLHDL 2.2   Thyroid Function Tests: No results for input(s): TSH, T4TOTAL, FREET4, T3FREE, THYROIDAB in the last 72 hours. Anemia Panel: No results for input(s): VITAMINB12, FOLATE, FERRITIN, TIBC, IRON, RETICCTPCT in the last 72 hours. Sepsis Labs: No results for input(s): PROCALCITON, LATICACIDVEN in the last 168 hours.  Recent Results (from the past 240 hour(s))  MRSA PCR Screening     Status: None   Collection Time: 08/26/16  1:27 AM  Result Value Ref Range Status    MRSA by PCR NEGATIVE NEGATIVE Final    Comment:        The GeneXpert MRSA Assay (FDA approved for NASAL specimens only), is one component of a comprehensive MRSA colonization surveillance program. It is not intended to diagnose MRSA infection nor to guide or monitor treatment for MRSA infections.        Radiology Studies: Ct Head Wo Contrast  Result Date: 2016/09/19 CLINICAL DATA:  Altered mental status EXAM: CT HEAD WITHOUT CONTRAST TECHNIQUE: Contiguous axial images were obtained from the base of the skull through the vertex without intravenous contrast. COMPARISON:  07/14/2011 FINDINGS: Brain: Suboptimal study due to motion artifacts. No intracranial hemorrhage, mass effect or midline shift. No acute cortical infarction. Stable atrophy and chronic white matter disease. Stable small calcified meningioma in left frontal frontal skull measures about 7 mm. Vascular: Atherosclerotic  calcifications of carotid siphon and vertebral arteries again noted. Skull: No skull fracture is noted. Sinuses/Orbits: Mild mucosal thickening with partial opacification right posterior ethmoid air cells. Other: None IMPRESSION: No acute intracranial abnormality. Stable atrophy and chronic white matter disease. No definite acute cortical infarction. There are motion artifacts limiting the examination. No definite acute cortical infarction. Electronically Signed   By: Natasha Mead M.D.   On: September 23, 2016 19:38   Ct Abdomen Pelvis W Contrast  Result Date: 08/26/2016 CLINICAL DATA:  Acute onset of generalized abdominal tenderness, nausea and vomiting. Initial encounter. EXAM: CT ABDOMEN AND PELVIS WITH CONTRAST TECHNIQUE: Multidetector CT imaging of the abdomen and pelvis was performed using the standard protocol following bolus administration of intravenous contrast. CONTRAST:  75 mL ISOVUE-300 IOPAMIDOL (ISOVUE-300) INJECTION 61% COMPARISON:  None. FINDINGS: Lower chest: Bibasilar atelectasis is noted. Mild wall  thickening is suggested along the distal esophagus. Diffuse coronary artery calcifications are seen. Hepatobiliary: Small nonspecific hypodensities are seen within the liver, measuring up to 7 mm in size. The liver is otherwise unremarkable in appearance. The gallbladder is grossly unremarkable in appearance. The common bile duct remains normal in caliber. Pancreas: The pancreas is within normal limits. Spleen: The spleen is unremarkable in appearance. Adrenals/Urinary Tract: The adrenal glands are unremarkable in appearance. The kidneys are within normal limits. There is no evidence of hydronephrosis. No renal or ureteral stones are identified. No perinephric stranding is seen. Stomach/Bowel: The stomach is unremarkable in appearance. The small bowel is within normal limits. The appendix is normal in caliber, without evidence of appendicitis. The colon is unremarkable in appearance. Scattered diverticulosis is noted along the cecum and descending colon. Vascular/Lymphatic: Diffuse calcification is seen along the abdominal aorta and its branches. The abdominal aorta is otherwise grossly unremarkable. The inferior vena cava is grossly unremarkable. No retroperitoneal lymphadenopathy is seen. No pelvic sidewall lymphadenopathy is identified. Reproductive: The bladder is moderately distended and within normal limits. The uterus is grossly unremarkable in appearance. The ovaries are relatively symmetric. No suspicious adnexal masses are seen. Other: No additional soft tissue abnormalities are seen. Musculoskeletal: No acute osseous abnormalities are identified. Multilevel vacuum phenomenon is noted along the lower lumbar spine. There is grade 1 anterolisthesis of L4 on L5. The visualized musculature is unremarkable in appearance. IMPRESSION: 1. No acute abnormality seen to explain the patient's symptoms. 2. Mild wall thickening suggested along the distal esophagus. Would correlate for any evidence of esophagitis. 3.  Scattered diverticulosis along the cecum and descending colon, without evidence of diverticulitis. 4. Diffuse aortic atherosclerosis. 5. Diffuse coronary artery calcifications. 6. Bibasilar atelectasis noted. 7. Small nonspecific hypodensities within the liver, measuring up to 7 mm in size. 8. Mild degenerative change along the lower lumbar spine. Electronically Signed   By: Roanna Raider M.D.   On: 08/26/2016 00:49   Dg Chest Port 1 View  Result Date: 2016/09/23 CLINICAL DATA:  Altered mental status. EXAM: PORTABLE CHEST 1 VIEW COMPARISON:  Radiograph of July 14, 2011. FINDINGS: Stable cardiomediastinal silhouette. Stable central pulmonary vascular congestion. No pneumothorax or pleural effusion is noted. Bony thorax is unremarkable. IMPRESSION: Stable central pulmonary vascular congestion. Electronically Signed   By: Lupita Raider, M.D.   On: 2016-09-23 19:00      Scheduled Meds: . aspirin  150 mg Rectal Daily  . cloNIDine  0.1 mg Transdermal Weekly  . famotidine (PEPCID) IV  20 mg Intravenous Q12H  . latanoprost  1 drop Both Eyes QHS  . polyvinyl alcohol  1-2 drop  Both Eyes BID  . sodium chloride flush  3 mL Intravenous Q12H   Continuous Infusions: . heparin 800 Units/hr (08/26/16 0600)     LOS: 1 day    Time spent: 40 minutes   Noralee Stain, DO [facility] [email or web address] Password [facility] 08/26/2016, 10:32 AM

## 2016-08-26 NOTE — Progress Notes (Signed)
Rn requested pt be deep suctioned as she is unable to get her secretions up past the back of her throat. Pt with weak congested cough at this time. RT NTS Right Nare X1 using 20F catheter with small amount of hemoptysis obtained. RT also deep suctioned back of pt's throat with moderate amount of thick hemoptysis obtained. RN made aware. Several 20F suction catheters and lube left in pt's room for future need. RT will continue to monitor.

## 2016-08-26 NOTE — Progress Notes (Signed)
RT called by RN to NTS pt. from Verbal Order by POD-D covering Physician, Pt. found on 2 lpm n/c, family at bedside, Oxygen >'d to 4 lpm before procedure, NTS done with minimal bleeding after pre-lubricating # 14 Adult catheter with surgilube, pt. tolerated fairly well, small amount obtained, mainly from back of throat, family assisted, <'d oxygen to 2 lpm after procedure.

## 2016-08-26 NOTE — Care Management CC44 (Signed)
Condition Code 44 Documentation Completed  Patient Details  Name: Brittany [name] MRN: [id number] Date of Birth: [birth year]   Condition Code 44 given:  Yes Patient signature on Condition Code 44 notice:  Yes Documentation of 2 MD's agreement:  Yes Code 44 added to claim:  Yes    Altair Appenzeller, Derrill MemoGenese M, RN 08/26/2016, 5:10 PM

## 2016-08-26 NOTE — Progress Notes (Signed)
Notified by RN regarding patient's epistaxis, bleeding in her mouth. Heparin gtt was stopped. Patient continued to desaturate despite frequent deep suctioning, currently on high flow [place] O2. I spoke with granddaughter over the phone. She understands that patient is declining rapidly. She has called family in. Will order morphine and ativan prn for comfort. She is DNR.    Noralee StainJennifer Marque Rademaker, DO [facility] [email or web address] Password [email or web address] 08/26/2016, 5:21 PM

## 2016-08-26 NOTE — Progress Notes (Signed)
CRITICAL VALUE ALERT  Critical value received:  PTT >200  Date of notification:  08/26/16  Time of notification:  11:10  Critical value read back:Yes.    Nurse who received alert:  Garnette CzechLexi Brittney Mucha  MD notified (1st page):  Dr. Alvino Chapelhoi  Time of first page:  11:10  MD notified (2nd page):  Time of second page:  Responding MD:  Dr. Alvino Chapelhoi  Time MD responded:  11:11

## 2016-08-26 NOTE — Progress Notes (Signed)
Patient still a lot of gurgling and sats 80%, called Respiratory for suction, patient placed on HFNC and NRB. sats 100%, patient pulling off NRB, patient has frank red blood in patinets mouth. Paged Dr. Alvino Chapelhoi, MD aware. MD also spoke to the granddaughter on the phone. Orders for morphine and ativan and to keep patient on floor. Family at bedside.

## 2016-08-26 NOTE — Progress Notes (Signed)
ANTICOAGULATION CONSULT NOTE  Pharmacy Consult for heparin Indication: r/o NSTEMI and r/o PE  Allergies  Allergen Reactions  . Lactose Intolerance (Gi) Nausea And Vomiting  . Pineapple Swelling    Swelling of the lips    Patient Measurements: Height: 5' (152.4 cm) Weight: 113 lb 1.5 oz (51.3 kg) IBW/kg (Calculated) : 45.5  Vital Signs: Temp: 98.4 F (36.9 C) (03/25 1153) Temp Source: Oral (03/25 1153) BP: 142/57 (03/25 1153) Pulse Rate: 76 (03/25 1153)  Labs:  Recent Labs  08/29/2016 1931 08/05/2016 2346 08/26/16 0303 08/26/16 0652 08/26/16 1003  HGB 10.2*  --  9.0* 9.4*  --   HCT 32.3*  --  29.7* 31.2*  --   PLT 339  --  287 280  --   APTT  --   --   --   --  >200*  LABPROT  --   --   --   --  15.4*  INR  --   --   --   --  1.22  HEPARINUNFRC  --   --   --   --  1.04*  CREATININE 0.79  --  0.84  --   --   TROPONINI 0.03* 0.04* 0.05*  --  0.05*    Estimated Creatinine Clearance: 23.7 mL/min (by C-G formula based on SCr of 0.84 mg/dL).   Medical History: Past Medical History:  Diagnosis Date  . Bilateral edema of lower extremity    intermittent   . CAD (coronary artery disease)   . CHF (congestive heart failure) (HCC)   . GERD (gastroesophageal reflux disease)   . Glaucoma 1997  . History of Doppler ultrasound 09/12/2010   carotid doppler; bilat blb/proximal ICAs demonstrated mild amount of fibrous plaque; mildly abnormal study   . History of echocardiogram 03/22/2009   EF >55%; normal LV size & systolic function; normal RV size & systolic function; mild aortic stenosis; mild MR; mild TR  . History of nuclear stress test 12/23/2008   normal perfusion to all regions; negative for ischemia; low risk scan; Dipyridamole   . Hyperlipemia   . Hypertension   . MI (myocardial infarction)   . Mild aortic valve stenosis   . Osteoarthritis 1997  . Ventricular arrhythmia     Assessment: 83yo female presents w/ AMS, etiology unclear, troponin mildly elevated >  concern for ACS, chronic respiratory failure and D-dimer found to be elevated > awaiting CT to r/o PE, to begin heparin.  Initial heparin level >1, aptt>200s. No overt bleeding noted, hgb/plt trending down. Instructed nurse to hold infusion for an hour then restart at much lower rate.  CTA being delayed d/t lack of access.  Goal of Therapy:  Heparin level 0.3-0.7 units/ml Monitor platelets by anticoagulation protocol: Yes   Plan:  Restart heparin at 400 units/hr Recheck heparin level tonight  Sheppard CoilFrank Wilson PharmD., BCPS Clinical Pharmacist Pager [phone] 08/26/2016 12:41 PM

## 2016-08-26 NOTE — Care Management Obs Status (Signed)
MEDICARE OBSERVATION STATUS NOTIFICATION   Patient Details  Name: Brittany [name] MRN: [id number] Date of Birth: Oct 25, 1910   Medicare Observation Status Notification Given:  Yes    CrutchfieldDerrill Memo, Shayden Gingrich M, RN 08/26/2016, 5:09 PM

## 2016-08-26 NOTE — ED Notes (Signed)
Patient transported to CT 

## 2016-08-26 NOTE — ED Notes (Addendum)
Pt started vomiting through her nose and desaturating.  This RN retrieved Dr. Clyde LundborgNiu from MD workroom.  RT called for deep suctioning.  Dr. Clyde LundborgNiu suspected bowel obstruction and ordered CT; gave zofran and pepcid.

## 2016-08-26 NOTE — Progress Notes (Signed)
ANTICOAGULATION CONSULT NOTE - Initial Consult  Pharmacy Consult for heparin Indication: r/o NSTEMI and r/o PE  Allergies  Allergen Reactions  . Lactose Intolerance (Gi) Nausea And Vomiting  . Pineapple Swelling    Swelling of the lips    Patient Measurements: Height: 5' (152.4 cm) Weight: 113 lb 1.5 oz (51.3 kg) IBW/kg (Calculated) : 45.5  Vital Signs: Temp: 98.9 F (37.2 C) (03/24 1814) Temp Source: Oral (03/24 1814) BP: 155/69 (03/25 0045) Pulse Rate: 91 (03/25 0045)  Labs:  Recent Labs  08/31/2016 1931 08/21/2016 2346  HGB 10.2*  --   HCT 32.3*  --   PLT 339  --   CREATININE 0.79  --   TROPONINI 0.03* 0.04*    Estimated Creatinine Clearance: 24.8 mL/min (by C-G formula based on SCr of 0.79 mg/dL).   Medical History: Past Medical History:  Diagnosis Date  . Bilateral edema of lower extremity    intermittent   . CAD (coronary artery disease)   . CHF (congestive heart failure) (HCC)   . GERD (gastroesophageal reflux disease)   . History of Doppler ultrasound 09/12/2010   carotid doppler; bilat blb/proximal ICAs demonstrated mild amount of fibrous plaque; mildly abnormal study   . History of echocardiogram 03/22/2009   EF >55%; normal LV size & systolic function; normal RV size & systolic function; mild aortic stenosis; mild MR; mild TR  . History of nuclear stress test 12/23/2008   normal perfusion to all regions; negative for ischemia; low risk scan; Dipyridamole   . Hyperlipemia   . Hypertension   . MI (myocardial infarction)   . Mild aortic valve stenosis   . Ventricular arrhythmia     Assessment: [age]yo female presents w/ AMS, etiology unclear, troponin mildly elevated > concern for ACS, chronic respiratory failure and D-dimer found to be elevated > awaiting CT to r/o PE, to begin heparin.  Goal of Therapy:  Heparin level 0.3-0.7 units/ml Monitor platelets by anticoagulation protocol: Yes   Plan:  Will give heparin 2000 units IV bolus x1 followed by  gtt at 800 units/hr and monitor heparin levels and CBC.  Vernard GamblesVeronda Matin Mattioli, PharmD, BCPS  08/26/2016,1:16 AM

## 2016-08-26 NOTE — Progress Notes (Signed)
Vascular access specialists assessed patient with ultrasound for 20 g PIV. Unable, poor veins, RN aware.   Eliot FordSarah Kyelle Urbas RN [facility]

## 2016-08-27 DIAGNOSIS — E860 Dehydration: Secondary | ICD-10-CM | POA: Diagnosis present

## 2016-08-27 DIAGNOSIS — I350 Nonrheumatic aortic (valve) stenosis: Secondary | ICD-10-CM | POA: Diagnosis present

## 2016-08-27 DIAGNOSIS — D7589 Other specified diseases of blood and blood-forming organs: Secondary | ICD-10-CM | POA: Diagnosis present

## 2016-08-27 DIAGNOSIS — Z9981 Dependence on supplemental oxygen: Secondary | ICD-10-CM | POA: Diagnosis not present

## 2016-08-27 DIAGNOSIS — E785 Hyperlipidemia, unspecified: Secondary | ICD-10-CM | POA: Diagnosis present

## 2016-08-27 DIAGNOSIS — R112 Nausea with vomiting, unspecified: Secondary | ICD-10-CM | POA: Diagnosis present

## 2016-08-27 DIAGNOSIS — G9340 Encephalopathy, unspecified: Secondary | ICD-10-CM | POA: Diagnosis present

## 2016-08-27 DIAGNOSIS — K219 Gastro-esophageal reflux disease without esophagitis: Secondary | ICD-10-CM | POA: Diagnosis present

## 2016-08-27 DIAGNOSIS — I2510 Atherosclerotic heart disease of native coronary artery without angina pectoris: Secondary | ICD-10-CM | POA: Diagnosis present

## 2016-08-27 DIAGNOSIS — R4182 Altered mental status, unspecified: Secondary | ICD-10-CM | POA: Diagnosis not present

## 2016-08-27 DIAGNOSIS — J9601 Acute respiratory failure with hypoxia: Secondary | ICD-10-CM | POA: Diagnosis not present

## 2016-08-27 DIAGNOSIS — Z515 Encounter for palliative care: Secondary | ICD-10-CM | POA: Diagnosis not present

## 2016-08-27 DIAGNOSIS — I110 Hypertensive heart disease with heart failure: Secondary | ICD-10-CM | POA: Diagnosis present

## 2016-08-27 DIAGNOSIS — R7989 Other specified abnormal findings of blood chemistry: Secondary | ICD-10-CM | POA: Diagnosis present

## 2016-08-27 DIAGNOSIS — Z7982 Long term (current) use of aspirin: Secondary | ICD-10-CM | POA: Diagnosis not present

## 2016-08-27 DIAGNOSIS — I5032 Chronic diastolic (congestive) heart failure: Secondary | ICD-10-CM | POA: Diagnosis present

## 2016-08-27 DIAGNOSIS — Z66 Do not resuscitate: Secondary | ICD-10-CM | POA: Diagnosis present

## 2016-08-27 DIAGNOSIS — R040 Epistaxis: Secondary | ICD-10-CM | POA: Diagnosis present

## 2016-08-27 DIAGNOSIS — J9621 Acute and chronic respiratory failure with hypoxia: Secondary | ICD-10-CM | POA: Diagnosis present

## 2016-08-27 DIAGNOSIS — E870 Hyperosmolality and hypernatremia: Secondary | ICD-10-CM | POA: Diagnosis present

## 2016-08-27 DIAGNOSIS — I252 Old myocardial infarction: Secondary | ICD-10-CM | POA: Diagnosis not present

## 2016-08-27 DIAGNOSIS — E739 Lactose intolerance, unspecified: Secondary | ICD-10-CM | POA: Diagnosis present

## 2016-08-27 DIAGNOSIS — Z91018 Allergy to other foods: Secondary | ICD-10-CM | POA: Diagnosis not present

## 2016-08-27 DIAGNOSIS — Z8249 Family history of ischemic heart disease and other diseases of the circulatory system: Secondary | ICD-10-CM | POA: Diagnosis not present

## 2016-08-27 LAB — BASIC METABOLIC PANEL
BUN: 20 mg/dL (ref 6–20)
CO2: 25 mmol/L (ref 22–32)
Calcium: 9.2 mg/dL (ref 8.9–10.3)
Chloride: >130 mmol/L (ref 101–111)
Creatinine, Ser: 0.75 mg/dL (ref 0.44–1.00)
GFR calc Af Amer: >60 mL/min (ref >60)
GFR calc non Af Amer: >60 mL/min (ref >60)
Glucose, Bld: 40 mg/dL — CL (ref 65–99)
POTASSIUM: 5 mmol/L (ref 3.5–5.1)
SODIUM: 147 mmol/L — AB (ref 135–145)

## 2016-08-27 LAB — URINE CULTURE: Culture: NO GROWTH

## 2016-08-27 MED ORDER — MORPHINE SULFATE (PF) 2 MG/ML IV SOLN
1.0000 mg | INTRAVENOUS | Status: DC | PRN
  Administered 2016-08-27 (×6): 2 mg via INTRAVENOUS
  Administered 2016-08-27: 4 mg via INTRAVENOUS
  Administered 2016-08-27 (×2): 2 mg via INTRAVENOUS
  Administered 2016-08-27: 4 mg via INTRAVENOUS
  Administered 2016-08-27 (×2): 2 mg via INTRAVENOUS
  Administered 2016-08-28: 1 mg via INTRAVENOUS
  Administered 2016-08-28 (×2): 2 mg via INTRAVENOUS
  Administered 2016-08-29: 1 mg via INTRAVENOUS
  Administered 2016-08-30 (×3): 2 mg via INTRAVENOUS
  Filled 2016-08-27 (×16): qty 1
  Filled 2016-08-27: qty 2
  Filled 2016-08-27 (×2): qty 1
  Filled 2016-08-27: qty 2

## 2016-08-27 MED ORDER — ONDANSETRON HCL 4 MG/2ML IJ SOLN: 1.0000 mg | Freq: Four times a day (QID) | INTRAMUSCULAR | Status: DC | PRN

## 2016-08-27 MED ORDER — ATROPINE SULFATE 1 % OP SOLN
1.0000 [drp] | OPHTHALMIC | Status: DC | PRN
  Administered 2016-08-28 (×3): 2 [drp] via SUBLINGUAL
  Administered 2016-08-28: 1 [drp] via SUBLINGUAL
  Administered 2016-08-28 – 2016-08-30 (×4): 2 [drp] via SUBLINGUAL
  Filled 2016-08-27: qty 5
  Filled 2016-08-27: qty 2

## 2016-08-27 NOTE — Progress Notes (Signed)
Nutrition Brief Note  Chart reviewed. Pt triggered for assessment due to MST >/= 2 Pt now transitioning to comfort care. NPO.  No further nutrition interventions warranted at this time.  Please re-consult as needed.   Romelle Starcherate Braulio Kiedrowski MS, RD, LDN [phone] Pager  [phone] Weekend/On-Call Pager

## 2016-08-27 NOTE — Progress Notes (Signed)
[place] TEAM 1 - Stepdown/ICU TEAM  Brittany KinsmanDorothy P [name]  ZOX:[id number] DOB: 1910-10-18 DOA: 08/05/2016 PCP: Gaspar Garbeichard W Tisovec, MD    Brief Narrative:  [age] y.o.femalewith history of hypertension, hyperlipidemia, GERD, aortic stenosis, ventricular arrhythmia, CAD, and diastolic CHF who presented with 24hrs of altered mental status, nausea and vomiting. Patient had temperature 99.3.  Her ED workup revealed a positive d-dimer, and the patient was started on a heparin drip and admitted to stepdown unit.  Subjective: The patient is unresponsive.  She does not appear to be in acute respiratory distress or suffering with uncontrolled pain.  I have spoken with multiple family members at the bedside and offered an opportunity to ask questions.  Assessment & Plan:  Acute encephalopathy -Etiology unclear -CT head unremarkable -Urinalysis unremarkable, urine culture no growth  -CXR unremarkable  -patient at baseline is quite functional for her age and is able to have a conversation, ambulate, feed herself -family desires to focus on comfort at this time, which I agree is most appropriate   Acute on chronic hypoxemic respiratory failure -Requires 2L at nighttime at home, but desatted to the 80s in the ED -Positive D-dimer could be due to PE, but is also very nonspecific   Macrocytosis  -no further labs to be drawn as pt is comfort care   Hypernatremia  -no further labs to be drawn as pt is comfort care   Nausea and vomiting -Etiology unclear, CT abd/pelvis unremarkable  CAD -Troponin very minimally elevated but not in pattern c/w ACS/MI  HTN  HLD  Chronic diastolic congestive heart failure -2-D echo on 10/14/15 showed EF of 55-60% with grade 1 diastolic dysfunction -no clinical evidence of an acute exacerbation   GERD  DVT prophylaxis: comofort care only  Code Status: NO CODE Family Communication: Spoke with multiple family members at bedside Disposition Plan: comfort  focused care - anticipate hospital death - do not transfer today   Consultants:  none  Procedures: none  Antimicrobials:  none  Objective: Blood pressure (!) 106/41, pulse 88, temperature 97.3 F (36.3 C), temperature source Axillary, resp. rate 16, height 5' (1.524 m), weight 51.3 kg (113 lb 1.5 oz), SpO2 100 %.  Intake/Output Summary (Last 24 hours) at 08/27/16 1115 Last data filed at 08/26/16 1549  Gross per 24 hour  Intake            41.14 ml  Output                0 ml  Net            41.14 ml   Filed Weights   08/26/16 0100 08/26/16 0300  Weight: 51.3 kg (113 lb 1.5 oz) 51.3 kg (113 lb 1.5 oz)    Examination: Resting comfortably in bed with no evidence of uncontrolled pain or respiratory distress  CBC:  Recent Labs Lab 08/07/2016 1931 08/26/16 0303 08/26/16 0652  WBC 7.3 12.6* 13.5*  NEUTROABS 6.2  --   --   HGB 10.2* 9.0* 9.4*  HCT 32.3* 29.7* 31.2*  MCV 97.6 101.4* 103.0*  PLT 339 287 280   Basic Metabolic Panel:  Recent Labs Lab 08/24/2016 1931 08/26/16 0303 08/27/16 0231  NA 143 143 147*  K 3.8 5.1 5.0  CL 105 107 >130*  CO2 27 27 25   GLUCOSE 128* 109* 40*  BUN 15 18 20   CREATININE 0.79 0.84 0.75  CALCIUM 10.4* 9.3 9.2   GFR: Estimated Creatinine Clearance: 24.8 mL/min (by C-G formula based on SCr  of 0.75 mg/dL).  Liver Function Tests:  Recent Labs Lab 08/29/2016 1931  AST 25  ALT 10*  ALKPHOS 73  BILITOT 0.3  PROT 8.0  ALBUMIN 3.8    Recent Labs Lab 08/03/2016 1931  LIPASE 16   Coagulation Profile:  Recent Labs Lab 08/26/16 1003  INR 1.22    Cardiac Enzymes:  Recent Labs Lab 08/28/2016 1931 08/04/2016 2346 08/26/16 0303 08/26/16 1003  TROPONINI 0.03* 0.04* 0.05* 0.05*   CBG:  Recent Labs Lab 08/26/16 0745  GLUCAP 93    Recent Results (from the past 240 hour(s))  Urine culture     Status: None   Collection Time: 08/16/2016 11:00 PM  Result Value Ref Range Status   Specimen Description URINE, CATHETERIZED  Final    Special Requests NONE  Final   Culture NO GROWTH  Final   Report Status 08/27/2016 FINAL  Final  MRSA PCR Screening     Status: None   Collection Time: 08/26/16  1:27 AM  Result Value Ref Range Status   MRSA by PCR NEGATIVE NEGATIVE Final    Comment:        The GeneXpert MRSA Assay (FDA approved for NASAL specimens only), is one component of a comprehensive MRSA colonization surveillance program. It is not intended to diagnose MRSA infection nor to guide or monitor treatment for MRSA infections.      Scheduled Meds: . polyvinyl alcohol  1-2 drop Both Eyes BID  . sodium chloride flush  3 mL Intravenous Q12H     LOS: 1 day   Lonia Blood, MD [facility] Office  [phone] Pager - Text Page per Brittany [name] as per below:  On-Call/Text Page:      [name]      password [email or web address]  If 7PM-7AM, please contact night-coverage [email or web address] Password [email or web address] 08/27/2016, 11:15 AM

## 2016-08-27 NOTE — Progress Notes (Signed)
Informed Dr. Sharon SellerMcClung that patients family and POA would like to make patient comfort measures with no vital signs or medication except for those of comfort.

## 2016-08-27 NOTE — Progress Notes (Signed)
Family interested in comfort care only at this point (per POA/granddaughter, Levonne LappingSamandra Tann, and daughter, Cherene AltesRoberta Tann).  Refused PM meds/eye gtts; only interested in making patient comfortable.  RN advised lab of no blood draw this AM.

## 2016-08-27 NOTE — Progress Notes (Signed)
Notified granddaughter/POA, Levonne LappingSamandra Tann, of status changes in patient.  Pt w/decreased responsiveness and seizure-like tremors - medicated w/PRN Ativan 1mg  IVP. HR 92 w/recent atrial tach.  Continue to monitor.

## 2016-08-28 LAB — HEMOGLOBIN A1C
HEMOGLOBIN A1C: 5 % (ref 4.8–5.6)
Mean Plasma Glucose: 97 mg/dL

## 2016-08-28 NOTE — Progress Notes (Signed)
[facility] Liaison RN visit  Received request from [place] for family interest in [facility] for possible transfer depending on patient condition.   Chart reviewed and received report from from bedside RN and [facility] on current patient status and discharge plans.  Met with family to confirm interest and explain services.  Supported and answered family questions about facility and general hospice philosophy.  Family and CSW aware that there is no bed available at [facility] at this time so a [facility] Liaison will check in with them in the morning to answer any further questions and update on availability.     Thank you,  Gar Ponto, [facility] Liaison [phone]

## 2016-08-28 NOTE — Progress Notes (Addendum)
[name] TEAM 1 - Stepdown/ICU TEAM  Brittany [name]  ZHY:[id number] DOB: 05/04/1911 DOA: 08/28/16 PCP: Gaspar Garbe, MD    Brief Narrative:  83 y.o.femalewith history of hypertension, hyperlipidemia, GERD, aortic stenosis, ventricular arrhythmia, CAD, and diastolic CHF who presented with 24hrs of altered mental status, nausea and vomiting. Patient had temperature 99.3.  Her ED workup revealed a positive d-dimer, and the patient was started on a heparin drip and admitted to stepdown unit.  Subjective: Patient remains unresponsive. No acute events overnight.  Assessment & Plan:  Acute encephalopathy -Etiology unclear -CT head unremarkable -Urinalysis unremarkable, urine culture no growth  -CXR unremarkable  -patient at baseline is quite functional for her age and is able to have a conversation, ambulate, feed herself -Patient remains comfort care.   Acute on chronic hypoxemic respiratory failure  Macrocytosis  -no further labs to be drawn as pt is comfort care   Hypernatremia  -no further labs to be drawn as pt is comfort care   Nausea and vomiting -Etiology unclear, CT abd/pelvis unremarkable  CAD -Troponin very minimally elevated but not in pattern c/w ACS/MI  HTN  HLD  Chronic diastolic congestive heart failure -2-D echo on 10/14/15 showed EF of 55-60% with grade 1 diastolic dysfunction -no clinical evidence of an acute exacerbation   GERD  DVT prophylaxis: comofort care only  Code Status: NO CODE Family Communication: No one at bedside, I have tried Brittany [name] to discuss if she is interested in home hospice. If she gets back to me today and she is agreeable for home hospice I will place a consult for case management and have palliative care involved to help transition with home hospice. Disposition Plan: comfort focused care - anticipate hospital death. Home hospice if ended up staying alive for longer than anticipated time.    Consultants:  none  Procedures: none  Antimicrobials:  none  Objective: Blood pressure (!) 129/45, pulse (!) 112, temperature 97.4 F (36.3 C), temperature source Oral, resp. rate 10, height 5' (1.524 m), weight 51.3 kg (113 lb 1.5 oz), SpO2 98 %.  Intake/Output Summary (Last 24 hours) at 08/28/16 1407 Last data filed at 08/28/16 1300  Gross per 24 hour  Intake                0 ml  Output                0 ml  Net                0 ml   Filed Weights   08/26/16 0100 08/26/16 0300  Weight: 51.3 kg (113 lb 1.5 oz) 51.3 kg (113 lb 1.5 oz)    Examination: Patient remains unresponsive to any physical and verbal stimuli at this time. Heart sounds and breath sounds heard.  CBC:  Recent Labs Lab 08-28-2016 1931 08/26/16 0303 08/26/16 0652  WBC 7.3 12.6* 13.5*  NEUTROABS 6.2  --   --   HGB 10.2* 9.0* 9.4*  HCT 32.3* 29.7* 31.2*  MCV 97.6 101.4* 103.0*  PLT 339 287 280   Basic Metabolic Panel:  Recent Labs Lab 08-28-16 1931 08/26/16 0303 08/27/16 0231  NA 143 143 147*  K 3.8 5.1 5.0  CL 105 107 >130*  CO2 27 27 25   GLUCOSE 128* 109* 40*  BUN 15 18 20   CREATININE 0.79 0.84 0.75  CALCIUM 10.4* 9.3 9.2   GFR: Estimated Creatinine Clearance: 24.8 mL/min (by C-G formula based on SCr of 0.75 mg/dL).  Liver Function Tests:  Recent Labs Lab 2016-09-21 1931  AST 25  ALT 10*  ALKPHOS 73  BILITOT 0.3  PROT 8.0  ALBUMIN 3.8    Recent Labs Lab 2016-09-21 1931  LIPASE 16   Coagulation Profile:  Recent Labs Lab 08/26/16 1003  INR 1.22    Cardiac Enzymes:  Recent Labs Lab 2016-09-21 1931 2016-09-21 2346 08/26/16 0303 08/26/16 1003  TROPONINI 0.03* 0.04* 0.05* 0.05*   CBG:  Recent Labs Lab 08/26/16 0745  GLUCAP 93    Recent Results (from the past 240 hour(s))  Urine culture     Status: None   Collection Time: 2016-09-21 11:00 PM  Result Value Ref Range Status   Specimen Description URINE, CATHETERIZED  Final   Special Requests NONE  Final    Culture NO GROWTH  Final   Report Status 08/27/2016 FINAL  Final  MRSA PCR Screening     Status: None   Collection Time: 08/26/16  1:27 AM  Result Value Ref Range Status   MRSA by PCR NEGATIVE NEGATIVE Final    Comment:        The GeneXpert MRSA Assay (FDA approved for NASAL specimens only), is one component of a comprehensive MRSA colonization surveillance program. It is not intended to diagnose MRSA infection nor to guide or monitor treatment for MRSA infections.      Scheduled Meds: . polyvinyl alcohol  1-2 drop Both Eyes BID  . sodium chloride flush  3 mL Intravenous Q12H     LOS: 3 days   Stephania FragminAnkit Ricketta Colantonio MD [facility] Office  [phone]   On-Call/Text Page:      [email or web address]      password [email or web address]  If 7PM-7AM, please contact night-coverage [email or web address] Password [email or web address] 08/28/2016, 2:07 PM

## 2016-08-28 NOTE — Progress Notes (Signed)
08/28/16 0915 Waiting for transfer orders

## 2016-08-28 NOTE — Care Management Note (Signed)
Case Management Note  Patient Details  Name: Brittany [name] MRN: [id number] Date of Birth: April 25, 1911             Action/Plan: Discharge Planning: NCM spoke to pt's dtr, Jenel Lucksoberta and grand-dtr, Vernard GamblesSamandra about dc with Hospice. Family wants [facility] if pt dc. Contacted [facility] Liaison and [facility]. Will follow up with attending about transfer to facility or hospital death. Per Hospice Liaison they do not have any available beds.    Expected Discharge Date:                  Expected Discharge Plan:  Hospice Medical Facility  In-House Referral:  Clinical Social Work  Discharge planning Services  CM Consult  Post Acute Care Choice:  NA Choice offered to:  NA  DME Arranged:  N/A DME Agency:  NA  HH Arranged:  NA HH Agency:  NA  Status of Service:  Completed, signed off  If discussed at Long Length of Stay Meetings, dates discussed:    Additional Comments:  Elliot CousinShavis, Marlayna Bannister Ellen, RN 08/28/2016, 3:19 PM

## 2016-08-28 NOTE — Progress Notes (Signed)
1010 Received pt from 4N via bed, unresponsive, move arms with sternal rub. On O2 at 3L per [facility]NC. Repositioned in bed. No family present upon arriving to the room.

## 2016-08-29 NOTE — Clinical Social Work Note (Signed)
CSW met with pt and granddaughters-Samandra Tann and Precious Haws at bedside, to address consult for hospice residential placement. Pt is non-verbal, but responds to pain. CSW introduced self and explained SW responsibilities. Landry Dyke and Angelita Ingles confirmed they want [facility] and have already spoke with a representative. CSW discussed other hospice residential sites and informed that [facility] is full at this time. CSW assessment to follow. CSW will follow for d/c planning,   Oretha Ellis, [place], [place] Work [phone]

## 2016-08-29 NOTE — Progress Notes (Signed)
TEAM 1 - Stepdown/ICU TEAM  Amedeo KinsmanDorothy P Patti  ZOX:[id number]:[id number] DOB: 1910/08/08 DOA: Jan 18, 2017 PCP: Gaspar Garbeichard W Tisovec, MD    Brief Narrative:  [age] y.o.femalewith history of hypertension, hyperlipidemia, GERD, aortic stenosis, ventricular arrhythmia, CAD, and diastolic CHF who presented with 24hrs of altered mental status, nausea and vomiting. Patient had temperature 99.3.  Her ED workup revealed a positive d-dimer, and the patient was started on a heparin drip and admitted to stepdown unit.  Subjective: The patient is unresponsive.  She does not appear to be in acute respiratory distress or suffering with uncontrolled pain.  There is no family present at the time that I checked in on the patient.  Assessment & Plan:  Acute encephalopathy -Etiology unclear -CT head unremarkable -Urinalysis unremarkable, urine culture no growth  -CXR unremarkable  -patient at baseline is quite functional for her age and is able to have a conversation, ambulate, feed herself -family desires to focus on comfort at this time, which I agree is most appropriate   Acute on chronic hypoxemic respiratory failure -Requires 2L at nighttime at home, but desatted to the 80s in the ED -Positive D-dimer could be due to PE, but is also very nonspecific   Macrocytosis  -no further labs to be drawn as pt is comfort care   Hypernatremia  -no further labs to be drawn as pt is comfort care   Nausea and vomiting -Etiology unclear, CT abd/pelvis unremarkable  CAD -Troponin very minimally elevated but not in pattern c/w ACS/MI  HTN  HLD  Chronic diastolic congestive heart failure -2-D echo on 10/14/15 showed EF of 55-60% with grade 1 diastolic dysfunction -no clinical evidence of an acute exacerbation   GERD  DVT prophylaxis: comofort care only  Code Status: NO CODE Family Communication:  Disposition Plan: comfort focused care - anticipate hospital death - if remains stable and bed opens  [facility]   Consultants:  none  Procedures: none  Antimicrobials:  none  Objective: Blood pressure (!) 159/69, pulse (!) 116, temperature 99.1 F (37.3 C), temperature source Oral, resp. rate 11, height 5' (1.524 m), weight 51.3 kg (113 lb 1.5 oz), SpO2 99 %.  Intake/Output Summary (Last 24 hours) at 08/29/16 1405 Last data filed at 08/28/16 2200  Gross per 24 hour  Intake                0 ml  Output                0 ml  Net                0 ml   Filed Weights   08/26/16 0100 08/26/16 0300  Weight: 51.3 kg (113 lb 1.5 oz) 51.3 kg (113 lb 1.5 oz)    Examination: Resting comfortably in bed with no evidence of uncontrolled pain or respiratory distress  CBC:  Recent Labs Lab 09/29/2016 1931 08/26/16 0303 08/26/16 0652  WBC 7.3 12.6* 13.5*  NEUTROABS 6.2  --   --   HGB 10.2* 9.0* 9.4*  HCT 32.3* 29.7* 31.2*  MCV 97.6 101.4* 103.0*  PLT 339 287 280   Basic Metabolic Panel:  Recent Labs Lab 09/29/2016 1931 08/26/16 0303 08/27/16 0231  NA 143 143 147*  K 3.8 5.1 5.0  CL 105 107 >130*  CO2 27 27 25   GLUCOSE 128* 109* 40*  BUN 15 18 20   CREATININE 0.79 0.84 0.75  CALCIUM 10.4* 9.3 9.2   GFR: Estimated  Creatinine Clearance: 24.8 mL/min (by C-G formula based on SCr of 0.75 mg/dL).  Liver Function Tests:  Recent Labs Lab 08/06/2016 1931  AST 25  ALT 10*  ALKPHOS 73  BILITOT 0.3  PROT 8.0  ALBUMIN 3.8    Recent Labs Lab 08/07/2016 1931  LIPASE 16   Coagulation Profile:  Recent Labs Lab 08/26/16 1003  INR 1.22    Cardiac Enzymes:  Recent Labs Lab 08/15/2016 1931 08/09/2016 2346 08/26/16 0303 08/26/16 1003  TROPONINI 0.03* 0.04* 0.05* 0.05*   CBG:  Recent Labs Lab 08/26/16 0745  GLUCAP 93    Recent Results (from the past 240 hour(s))  Urine culture     Status: None   Collection Time: 09/01/2016 11:00 PM  Result Value Ref Range Status   Specimen Description URINE, CATHETERIZED  Final   Special Requests NONE   Final   Culture NO GROWTH  Final   Report Status 08/27/2016 FINAL  Final  MRSA PCR Screening     Status: None   Collection Time: 08/26/16  1:27 AM  Result Value Ref Range Status   MRSA by PCR NEGATIVE NEGATIVE Final    Comment:        The GeneXpert MRSA Assay (FDA approved for NASAL specimens only), is one component of a comprehensive MRSA colonization surveillance program. It is not intended to diagnose MRSA infection nor to guide or monitor treatment for MRSA infections.      Scheduled Meds: . polyvinyl alcohol  1-2 drop Both Eyes BID  . sodium chloride flush  3 mL Intravenous Q12H     LOS: 2 days   Lonia Blood, MD [facility] Office  [phone] Pager - Text Page per Loretha Stapler as per below:  On-Call/Text Page:      [email or web address]      password [email or web address]  If 7PM-7AM, please contact night-coverage [email or web address] Password [email or web address] 08/29/2016, 2:05 PM

## 2016-08-29 NOTE — Progress Notes (Signed)
[facility]  RN  Went to visit patient.  Her daughter was at bedside.  Daughter advised no changes at this time.   I advised we would continue to monitor for bed availability.  Tonette BihariJeneya, CSW, aware.   Thank you,  Adele BarthelAmy Evans, RN, BSN  [facility]ia[facility] [phone]  All hospital liaison's are now on AMION.

## 2016-08-30 DIAGNOSIS — J9601 Acute respiratory failure with hypoxia: Secondary | ICD-10-CM

## 2016-08-30 DIAGNOSIS — R4182 Altered mental status, unspecified: Secondary | ICD-10-CM

## 2016-08-30 DIAGNOSIS — I5032 Chronic diastolic (congestive) heart failure: Secondary | ICD-10-CM

## 2016-08-30 MED ORDER — LORAZEPAM 0.5 MG PO TABS: 0.5000 mg | ORAL_TABLET | Freq: Three times a day (TID) | ORAL | 0 refills | Status: AC

## 2016-08-30 MED ORDER — ATROPINE SULFATE 1 % OP SOLN: 1.0000 [drp] | OPHTHALMIC | 12 refills | Status: AC | PRN

## 2016-08-30 MED ORDER — MORPHINE SULFATE (CONCENTRATE) 10 MG /0.5 ML PO SOLN: 5.0000 mg | ORAL | 0 refills | Status: AC | PRN

## 2016-09-02 NOTE — Discharge Summary (Signed)
Death Summary  Brittany KinsmanDorothy P [name] ZOX:[id number] DOB: [birth year] DOA: March 06, 2017  PCP: Brittany Garbeichard W Tisovec, MD PCP/Office notified: epic  Admit date: March 06, 2017 Date of Death: 08/04/2016  Final Diagnoses:  Principal Problem:   Acute encephalopathy Active Problems:   CAD in native artery   Essential hypertension   Hyperlipidemia with target LDL less than 70   Mild aortic stenosis by prior echocardiogram   Nausea & vomiting   Chronic respiratory failure with hypoxia (HCC)   Elevated troponin   Hypercalcemia   Chronic diastolic CHF (congestive heart failure) (HCC)     History of present illness:  [age] y.o.femalewith history of hypertension, hyperlipidemia, GERD, aortic stenosis, ventricular arrhythmia, CAD, and diastolic CHF who presented with 24hrs of altered mental status, nausea and vomiting. Patient had temperature 99.3. Her ED workup revealed a positive d-dimer, and the patient was started on a heparin drip and admitted to stepdown unit. Patient is remained encephalopathic, obtunded. She is DNR, under hospice care per family discussions    Hospital Course:   Acute encephalopathy. Etiology unclear. CT head unremarkable. Urinalysis unremarkable, urine culture no growth. CXR unremarkable  -family desired to focus on comfort at this time, which I agree is most appropriate. Cont comfort hospice care   Acute on chronic hypoxemic respiratory failure. Requires 2L at nighttime at home, but desatted to the 80s in the ED -Positive D-dimer could be due to PE, but is also very nonspecific  Macrocytosis. no further labs to be drawn as pt is comfort care  Hypernatremia. no further labs to be drawn as pt is comfort care  Nausea and vomiting. Etiology unclear, CT abd/pelvis unremarkable CAD. Troponin very minimally elevated but not in pattern c/w ACS/MI Chronic diastolic congestive heart failure. 2-D echo on 10/14/15 showed EF of 55-60% with grade 1 diastolic dysfunction. no clinical  evidence of an acute exacerbation    Prognosis remains very poorwith ongoing multiorgan dysfunction, tachypnea, tachycardia, obtundation. D/w her family at length. They have requested to cont comfort/hospice care with comfort medications.  Patient was planned for hospice care at [facility].  -She died today on 08/24/2016 at 17.10 peacefully under comfort care, family at the bedside.      Time: 17.10  Signed:  Jonette MateBURIEV, Kinzi Frediani N  [facility] 08/05/2016, 5:28 PM

## 2016-09-02 NOTE — Clinical Social Work Note (Signed)
CSW facilitated patient discharge including contacting patient family and facility to confirm patient discharge plans. Clinical information faxed to facility and family agreeable with plan. CSW arranged ambulance transport via [facility] to [facility]. RN to call report prior to discharge ([phone]).  CSW will sign off for now as social work intervention is no longer needed. Please consult us again if new needs arise.  Perry Brucato, CSW [phone]  

## 2016-09-02 NOTE — Discharge Summary (Signed)
Physician Discharge Summary  Brittany KinsmanDorothy P [name] ZOX:[id number] DOB: Oct 07, 1910 DOA: 01/15/2017  PCP: Gaspar Garbeichard W Tisovec, MD  Admit date: 01/15/2017 Discharge date: 08/12/2016  Time spent: >35 minutes  Recommendations for Outpatient Follow-up:  Hospice   Discharge Diagnoses:  Principal Problem:   Acute encephalopathy Active Problems:   CAD in native artery   Essential hypertension   Hyperlipidemia with target LDL less than 70   Mild aortic stenosis by prior echocardiogram   Nausea & vomiting   Chronic respiratory failure with hypoxia (HCC)   Elevated troponin   Hypercalcemia   Chronic diastolic CHF (congestive heart failure) (HCC)   Discharge Condition: stable, but poor prognosis   Diet recommendation: comfort   Filed Weights   08/26/16 0100 08/26/16 0300  Weight: 51.3 kg (113 lb 1.5 oz) 51.3 kg (113 lb 1.5 oz)    History of present illness:  [age] y.o.femalewith history of hypertension, hyperlipidemia, GERD, aortic stenosis, ventricular arrhythmia, CAD, and diastolic CHF who presented with 24hrs of altered mental status, nausea and vomiting. Patient had temperature 99.3. Her ED workup revealed a positive d-dimer, and the patient was started on a heparin drip and admitted to stepdown unit. Patient is remained encephalopathic, obtunded. She is DNR, under hospice care per family discussions    Hospital Course:   Acute encephalopathy. Etiology unclear. CT head unremarkable. Urinalysis unremarkable, urine culture no growth. CXR unremarkable  -family desired to focus on comfort at this time, which I agree is most appropriate. Cont comfort hospice care    Acute on chronic hypoxemic respiratory failure. Requires 2L at nighttime at home, but desatted to the 80s in the ED -Positive D-dimer could be due to PE, but is also very nonspecific  Macrocytosis. no further labs to be drawn as pt is comfort care  Hypernatremia. no further labs to be drawn as pt is comfort care  Nausea and  vomiting. Etiology unclear, CT abd/pelvis unremarkable CAD. Troponin very minimally elevated but not in pattern c/w ACS/MI Chronic diastolic congestive heart failure. 2-D echo on 10/14/15 showed EF of 55-60% with grade 1 diastolic dysfunction. no clinical evidence of an acute exacerbation    Prognosis remains very poor with ongoing multiorgan dysfunction, tachypnea, tachycardia, obtundations, likely <24 hrs. D/w her family at length. They have requested to cont comfort/hospice care, cont comfort medications.   -cont hospice care at [facility]  Procedures:  none (i.e. Studies not automatically included, echos, thoracentesis, etc; not x-rays)  Consultations:  none  Discharge Exam: Vitals:   08/04/2016 0047 08/17/2016 1355  BP: (!) 139/59 (!) 74/33  Pulse: (!) 146 (!) 135  Resp: 20 20  Temp: 100 F (37.8 C)     General: comfortable  Cardiovascular: s1,s2 tachycardia  Respiratory: poor ventilation   Discharge Instructions   Allergies as of 08/22/2016      Reactions   Lactose Intolerance (gi) Nausea And Vomiting   Pineapple Swelling   Swelling of the lips      Medication List    STOP taking these medications   acetaminophen 650 MG CR tablet Commonly known as:  TYLENOL   amLODipine 10 MG tablet Commonly known as:  NORVASC   aspirin EC 81 MG tablet   bimatoprost 0.01 % Soln Commonly known as:  LUMIGAN   cloNIDine 0.2 MG tablet Commonly known as:  CATAPRES   fentaNYL 50 MCG/HR Commonly known as:  DURAGESIC - dosed mcg/hr   furosemide 40 MG tablet Commonly known as:  LASIX   isosorbide mononitrate 60 MG 24  hr tablet Commonly known as:  IMDUR   KRILL OIL OMEGA-3 PO   multivitamin with minerals Tabs tablet   omeprazole 20 MG capsule Commonly known as:  PRILOSEC   rosuvastatin 10 MG tablet Commonly known as:  CRESTOR   SYSTANE BALANCE 0.6 % Soln Generic drug:  Propylene Glycol     TAKE these medications   atropine 1 % ophthalmic solution Place 1-2  drops under the tongue as needed (for heavy secretions - to aid in comfort).   LORazepam 0.5 MG tablet Commonly known as:  ATIVAN Take 1 tablet (0.5 mg total) by mouth every 8 (eight) hours.   morphine CONCENTRATE 10 mg / 0.5 ml concentrated solution Place 0.25 mLs (5 mg total) under the tongue every 4 (four) hours as needed for severe pain.      Allergies  Allergen Reactions  . Lactose Intolerance (Gi) Nausea And Vomiting  . Pineapple Swelling    Swelling of the lips      The results of significant diagnostics from this hospitalization (including imaging, microbiology, ancillary and laboratory) are listed below for reference.    Significant Diagnostic Studies: Ct Head Wo Contrast  Result Date: 08/04/2016 CLINICAL DATA:  Altered mental status EXAM: CT HEAD WITHOUT CONTRAST TECHNIQUE: Contiguous axial images were obtained from the base of the skull through the vertex without intravenous contrast. COMPARISON:  07/14/2011 FINDINGS: Brain: Suboptimal study due to motion artifacts. No intracranial hemorrhage, mass effect or midline shift. No acute cortical infarction. Stable atrophy and chronic white matter disease. Stable small calcified meningioma in left frontal frontal skull measures about 7 mm. Vascular: Atherosclerotic calcifications of carotid siphon and vertebral arteries again noted. Skull: No skull fracture is noted. Sinuses/Orbits: Mild mucosal thickening with partial opacification right posterior ethmoid air cells. Other: None IMPRESSION: No acute intracranial abnormality. Stable atrophy and chronic white matter disease. No definite acute cortical infarction. There are motion artifacts limiting the examination. No definite acute cortical infarction. Electronically Signed   By: Natasha Mead M.D.   On: 08/24/2016 19:38   Ct Abdomen Pelvis W Contrast  Result Date: 08/26/2016 CLINICAL DATA:  Acute onset of generalized abdominal tenderness, nausea and vomiting. Initial encounter. EXAM:  CT ABDOMEN AND PELVIS WITH CONTRAST TECHNIQUE: Multidetector CT imaging of the abdomen and pelvis was performed using the standard protocol following bolus administration of intravenous contrast. CONTRAST:  75 mL ISOVUE-300 IOPAMIDOL (ISOVUE-300) INJECTION 61% COMPARISON:  None. FINDINGS: Lower chest: Bibasilar atelectasis is noted. Mild wall thickening is suggested along the distal esophagus. Diffuse coronary artery calcifications are seen. Hepatobiliary: Small nonspecific hypodensities are seen within the liver, measuring up to 7 mm in size. The liver is otherwise unremarkable in appearance. The gallbladder is grossly unremarkable in appearance. The common bile duct remains normal in caliber. Pancreas: The pancreas is within normal limits. Spleen: The spleen is unremarkable in appearance. Adrenals/Urinary Tract: The adrenal glands are unremarkable in appearance. The kidneys are within normal limits. There is no evidence of hydronephrosis. No renal or ureteral stones are identified. No perinephric stranding is seen. Stomach/Bowel: The stomach is unremarkable in appearance. The small bowel is within normal limits. The appendix is normal in caliber, without evidence of appendicitis. The colon is unremarkable in appearance. Scattered diverticulosis is noted along the cecum and descending colon. Vascular/Lymphatic: Diffuse calcification is seen along the abdominal aorta and its branches. The abdominal aorta is otherwise grossly unremarkable. The inferior vena cava is grossly unremarkable. No retroperitoneal lymphadenopathy is seen. No pelvic sidewall lymphadenopathy is identified. Reproductive:  The bladder is moderately distended and within normal limits. The uterus is grossly unremarkable in appearance. The ovaries are relatively symmetric. No suspicious adnexal masses are seen. Other: No additional soft tissue abnormalities are seen. Musculoskeletal: No acute osseous abnormalities are identified. Multilevel vacuum  phenomenon is noted along the lower lumbar spine. There is grade 1 anterolisthesis of L4 on L5. The visualized musculature is unremarkable in appearance. IMPRESSION: 1. No acute abnormality seen to explain the patient's symptoms. 2. Mild wall thickening suggested along the distal esophagus. Would correlate for any evidence of esophagitis. 3. Scattered diverticulosis along the cecum and descending colon, without evidence of diverticulitis. 4. Diffuse aortic atherosclerosis. 5. Diffuse coronary artery calcifications. 6. Bibasilar atelectasis noted. 7. Small nonspecific hypodensities within the liver, measuring up to 7 mm in size. 8. Mild degenerative change along the lower lumbar spine. Electronically Signed   By: Roanna Raider M.D.   On: 08/26/2016 00:49   Dg Chest Port 1 View  Result Date: 09/01/2016 CLINICAL DATA:  Altered mental status. EXAM: PORTABLE CHEST 1 VIEW COMPARISON:  Radiograph of July 14, 2011. FINDINGS: Stable cardiomediastinal silhouette. Stable central pulmonary vascular congestion. No pneumothorax or pleural effusion is noted. Bony thorax is unremarkable. IMPRESSION: Stable central pulmonary vascular congestion. Electronically Signed   By: Lupita Raider, M.D.   On: 2016/09/01 19:00    Microbiology: Recent Results (from the past 240 hour(s))  Urine culture     Status: None   Collection Time: 01-Sep-2016 11:00 PM  Result Value Ref Range Status   Specimen Description URINE, CATHETERIZED  Final   Special Requests NONE  Final   Culture NO GROWTH  Final   Report Status 08/27/2016 FINAL  Final  MRSA PCR Screening     Status: None   Collection Time: 08/26/16  1:27 AM  Result Value Ref Range Status   MRSA by PCR NEGATIVE NEGATIVE Final    Comment:        The GeneXpert MRSA Assay (FDA approved for NASAL specimens only), is one component of a comprehensive MRSA colonization surveillance program. It is not intended to diagnose MRSA infection nor to guide or monitor treatment  for MRSA infections.      Labs: Basic Metabolic Panel:  Recent Labs Lab 2016-09-01 1931 08/26/16 0303 08/27/16 0231  NA 143 143 147*  K 3.8 5.1 5.0  CL 105 107 >130*  CO2 27 27 25   GLUCOSE 128* 109* 40*  BUN 15 18 20   CREATININE 0.79 0.84 0.75  CALCIUM 10.4* 9.3 9.2   Liver Function Tests:  Recent Labs Lab 01-Sep-2016 1931  AST 25  ALT 10*  ALKPHOS 73  BILITOT 0.3  PROT 8.0  ALBUMIN 3.8    Recent Labs Lab Sep 01, 2016 1931  LIPASE 16   No results for input(s): AMMONIA in the last 168 hours. CBC:  Recent Labs Lab 2016/09/01 1931 08/26/16 0303 08/26/16 0652  WBC 7.3 12.6* 13.5*  NEUTROABS 6.2  --   --   HGB 10.2* 9.0* 9.4*  HCT 32.3* 29.7* 31.2*  MCV 97.6 101.4* 103.0*  PLT 339 287 280   Cardiac Enzymes:  Recent Labs Lab September 01, 2016 1931 Sep 01, 2016 2346 08/26/16 0303 08/26/16 1003  TROPONINI 0.03* 0.04* 0.05* 0.05*   BNP: BNP (last 3 results)  Recent Labs  2016/09/01 2346  BNP 232.3*    ProBNP (last 3 results) No results for input(s): PROBNP in the last 8760 hours.  CBG:  Recent Labs Lab 08/26/16 0745  GLUCAP 93  SignedEsperanza Sheets  [facility] 09/01/2016, 2:48 PM

## 2016-09-02 NOTE — Progress Notes (Signed)
[facility] PROGRESS NOTE  Brittany KinsmanDorothy P [name] RUE:[id number] DOB: [birth year] DOA: 08/31/2016 PCP: Gaspar Garbeichard W Tisovec, MD  Brief Narrative:  [age] y.o.femalewith history of hypertension, hyperlipidemia, GERD, aortic stenosis, ventricular arrhythmia, CAD, and diastolic CHF who presented with 24hrs of altered mental status, nausea and vomiting. Patient had temperature 99.3. Her ED workup revealed a positive d-dimer, and the patient was started on a heparin drip and admitted to stepdown unit. Patient is DNR.    Assessment & Plan:  Acute encephalopathy.Etiology unclear. CT head unremarkable. Urinalysis unremarkable, urine culture no growth. CXR unremarkable  -patient at baseline is quite functional for her age and is able to have a conversation, ambulate, feed herself -family desires to focus on comfort at this time, which I agree is most appropriate.    Acute on chronic hypoxemic respiratory failure. Requires 2L at nighttime at home, but desatted to the 80s in the ED -Positive D-dimer could be due to PE, but is also very nonspecific   Macrocytosis. no further labs to be drawn as pt is comfort care   Hypernatremia. no further labs to be drawn as pt is comfort care   Nausea and vomiting. Etiology unclear, CT abd/pelvis unremarkable  CAD. Troponin very minimally elevated but not in pattern c/w ACS/MI  Chronic diastolic congestive heart failure -2-D echo on 10/14/15 showed EF of 55-60% with grade 1 diastolic dysfunction -no clinical evidence of an acute exacerbation    Prognosis is very poor with ongoing multiorgan dysfunction, tachypnea, tachycardia, obtundations. likely <24 hrs. Appears GIP hospice candidate. Consult palliative service   Code Status: DNR Family Communication: d/w her daughter, rn (indicate person spoken with, relationship, and if by phone, the number) Disposition Plan: likely GIP inpatient. Vs [facility]    Consultants:  Palliative care    Procedures:  none  Antibiotics:  noen (indicate start date, and stop date if known)  HPI/Subjective: Does not respond./   Objective: Vitals:   08/29/16 1401 02-Sep-2016 0047  BP: (!) 159/69 (!) 139/59  Pulse: (!) 116 (!) 146  Resp: 11 20  Temp: 99.1 F (37.3 C) 100 F (37.8 C)    Intake/Output Summary (Last 24 hours) at 02-Sep-2016 1138 Last data filed at 02-Sep-2016 0912  Gross per 24 hour  Intake                0 ml  Output                0 ml  Net                0 ml   Filed Weights   08/26/16 0100 08/26/16 0300  Weight: 51.3 kg (113 lb 1.5 oz) 51.3 kg (113 lb 1.5 oz)    Exam:   General:  Unresponsive   Cardiovascular: s1,s2 tachy  Respiratory: poor ventilation   Abdomen: soft  Musculoskeletal: mild edema    Data Reviewed: Basic Metabolic Panel:  Recent Labs Lab 08/29/2016 1931 08/26/16 0303 08/27/16 0231  NA 143 143 147*  K 3.8 5.1 5.0  CL 105 107 >130*  CO2 27 27 25   GLUCOSE 128* 109* 40*  BUN 15 18 20   CREATININE 0.79 0.84 0.75  CALCIUM 10.4* 9.3 9.2   Liver Function Tests:  Recent Labs Lab 08/08/2016 1931  AST 25  ALT 10*  ALKPHOS 73  BILITOT 0.3  PROT 8.0  ALBUMIN 3.8    Recent Labs Lab 08/29/2016 1931  LIPASE 16   No results for input(s): AMMONIA in the last  168 hours. CBC:  Recent Labs Lab 08/12/2016 1931 08/26/16 0303 08/26/16 0652  WBC 7.3 12.6* 13.5*  NEUTROABS 6.2  --   --   HGB 10.2* 9.0* 9.4*  HCT 32.3* 29.7* 31.2*  MCV 97.6 101.4* 103.0*  PLT 339 287 280   Cardiac Enzymes:  Recent Labs Lab 08/27/2016 1931 08/04/2016 2346 08/26/16 0303 08/26/16 1003  TROPONINI 0.03* 0.04* 0.05* 0.05*   BNP (last 3 results)  Recent Labs  08/05/2016 2346  BNP 232.3*    ProBNP (last 3 results) No results for input(s): PROBNP in the last 8760 hours.  CBG:  Recent Labs Lab 08/26/16 0745  GLUCAP 93    Recent Results (from the past 240 hour(s))  Urine culture     Status: None   Collection Time: 08/05/2016 11:00 PM   Result Value Ref Range Status   Specimen Description URINE, CATHETERIZED  Final   Special Requests NONE  Final   Culture NO GROWTH  Final   Report Status 08/27/2016 FINAL  Final  MRSA PCR Screening     Status: None   Collection Time: 08/26/16  1:27 AM  Result Value Ref Range Status   MRSA by PCR NEGATIVE NEGATIVE Final    Comment:        The GeneXpert MRSA Assay (FDA approved for NASAL specimens only), is one component of a comprehensive MRSA colonization surveillance program. It is not intended to diagnose MRSA infection nor to guide or monitor treatment for MRSA infections.      Studies: No results found.  Scheduled Meds: . polyvinyl alcohol  1-2 drop Both Eyes BID  . sodium chloride flush  3 mL Intravenous Q12H   Continuous Infusions:  Principal Problem:   Acute encephalopathy Active Problems:   CAD in native artery   Essential hypertension   Hyperlipidemia with target LDL less than 70   Mild aortic stenosis by prior echocardiogram   Nausea & vomiting   Chronic respiratory failure with hypoxia (HCC)   Elevated troponin   Hypercalcemia   Chronic diastolic CHF (congestive heart failure) (HCC)    Time spent: >35 minutes     Esperanza Sheets  [facility] Pager [phone]. If 7PM-7AM, please contact night-coverage at [email or web address], password [facility] September 21, 2016, 11:38 AM  LOS: 3 days

## 2016-09-02 NOTE — Progress Notes (Signed)
Palliative Medicine RN Note: Order noted for reason: "hospice." Pt has already been set up with hospice and [facility] who confirmed, and the [facility] notes agree. PMT will not see pt at this time, as the goals are clear and d/c plan is set. Should other needs arise, please re-consult.  Thank you. Margret ChanceMelanie G. Garik Diamant, RN, BSN, [facility] 08/22/2016 12:17 PM Cell [phone] 8:00-4:00 Monday-Friday Office [phone]

## 2016-09-02 NOTE — Progress Notes (Signed)
   09-18-2016 1717  Attending Physican Contact  Attending Physician Notified Y  Attending Physician Name dr. York SpanielBuriev  Will the above attending physician sign death certificate?  (non ED physician) Yes  Post Mortem Checklist  Date of Death 09-18-2016  Time of Death 1710  Pronounced By Sharon SellerKristie Kato Wieczorek,RN Zenaida Calwitan,RN  Next of kin notified Yes  Name of next of kin notified of death Cherene AltesRoberta Tann  Contact Person's Relationship to Patient Daughter  Contact Person's Phone Number [phone]  Did the patient die unattended? No  Patient restrained? Not applicable  Weight 51.3 kg (113 lb 1.5 oz)  [facility]  Notification Date 09-18-2016  Notification Time 1729  [place] [facility] Number [id number]  Is patient a potential donor? N  Autopsy  Autopsy requested by N/A  Patient Belongings/Medications Returned  Patient belongings from bedside/safe/pharmacy returned  None  Dead on Arrival (Emergency Department)  Patient dead on arrival? No  Medical Examiner  Is this a medical examiner's case? [facility]  [facility]  Funeral home name/address/phone # [facility] [facility] - [address] [place]- [phone]  [place] [facility]

## 2016-09-02 NOTE — Consult Note (Signed)
[facility] room available for patient today. Met with family at bedside to confirm desire to transfer today. [facility] completed paper work at bedside. Dr. Orpah Melter to assume care.   Please fax discharge summary to [phone].  RN please call report to [phone].  Thank you,  Erling Conte, LCSW [phone]

## 2016-09-02 NOTE — Progress Notes (Signed)
Report called to [facility].  

## 2016-09-02 DEATH — deceased

## 2018-10-22 IMAGING — CT CT ABD-PELV W/ CM
2 of 5 series · 15 of 46 positions shown, 17 images · IV contrast (Omni 300)
Comparison: None.

CLINICAL DATA: Acute onset of generalized abdominal tenderness,
nausea and vomiting. Initial encounter.

EXAM:
CT ABDOMEN AND PELVIS WITH CONTRAST
TECHNIQUE: Multidetector CT imaging of the abdomen and pelvis was performed
using the standard protocol following bolus administration of
intravenous contrast.
CONTRAST:  75 mL JJOT5C-ZFF IOPAMIDOL (JJOT5C-ZFF) INJECTION 61%

[Series 3: a/p w/ 5mm · axial · 0.72mm/px · z∈[-369,+1]mm · 12 of 84 slices shown, 14 images]
[im 5/84  soft-tissue]
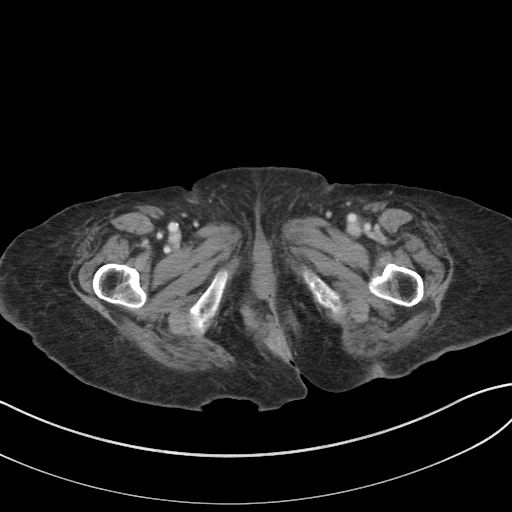
[im 5/84  bone]
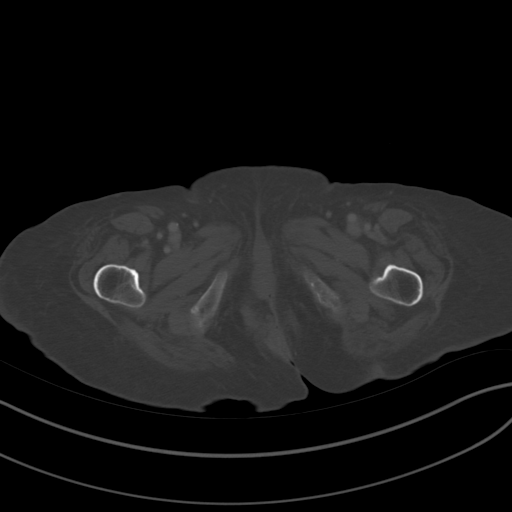
[im 14/84  soft-tissue]
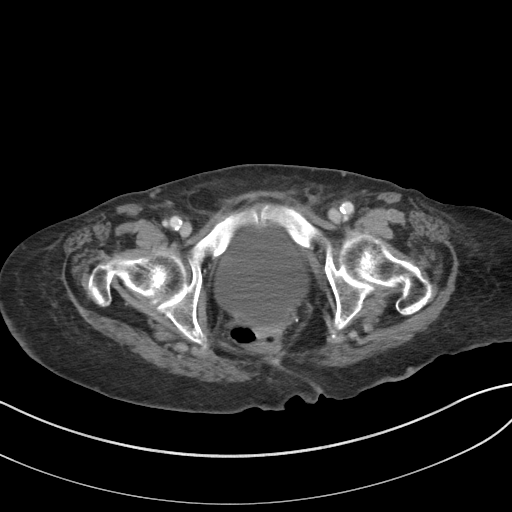
[im 18/84  soft-tissue]
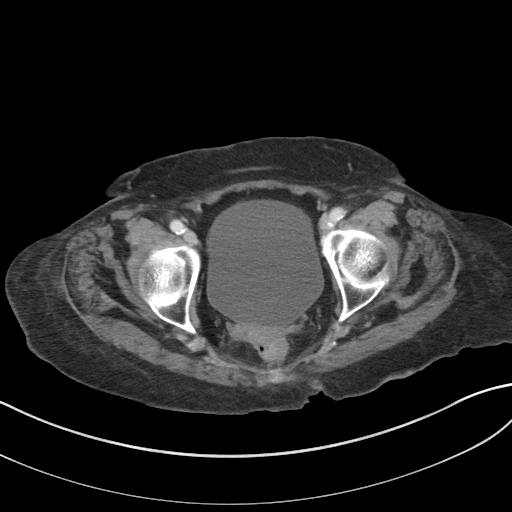
[im 27/84  soft-tissue]
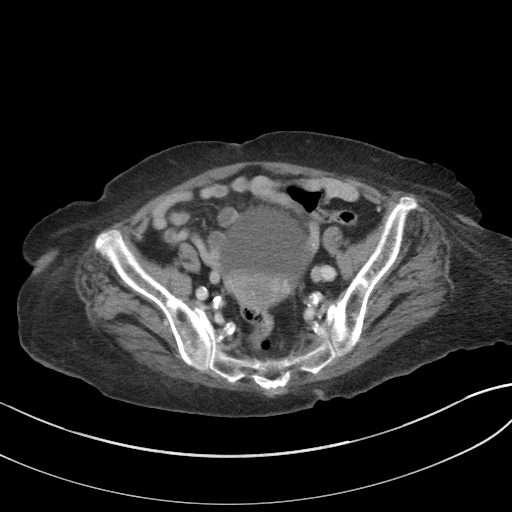
[im 31/84  soft-tissue]
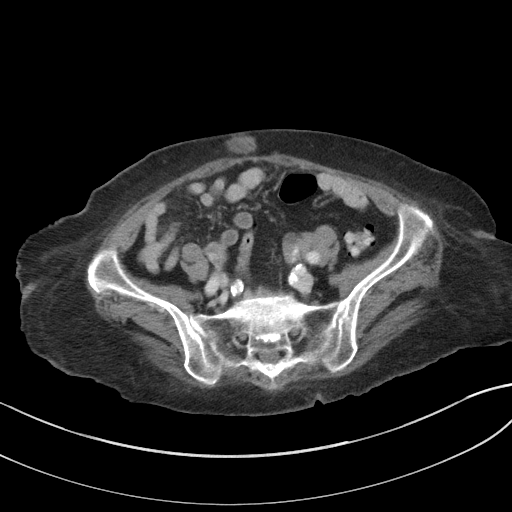
[im 40/84  soft-tissue]
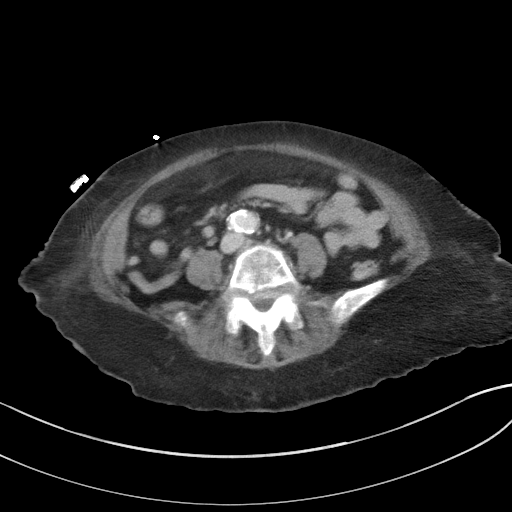
[im 44/84  soft-tissue]
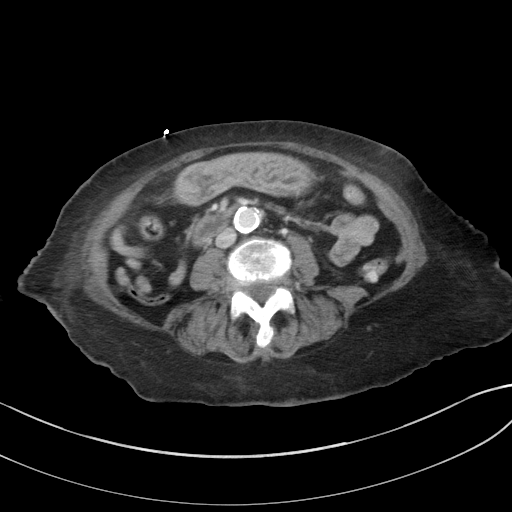
[im 53/84  soft-tissue]
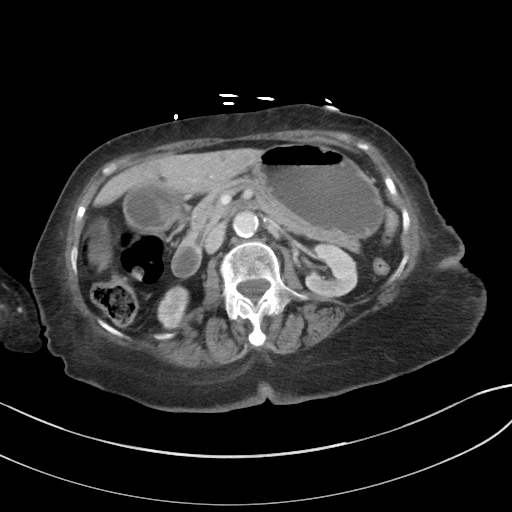
[im 57/84  soft-tissue]
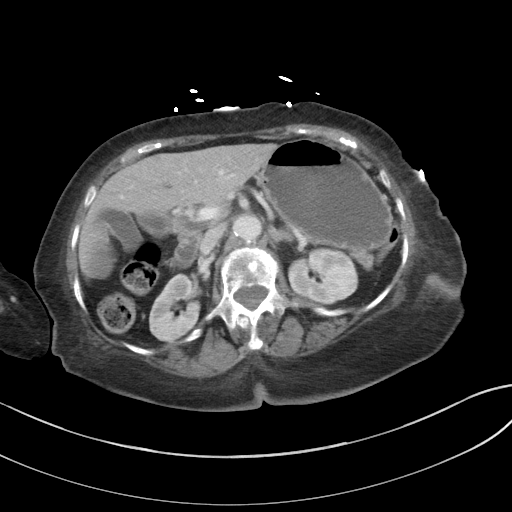
[im 57/84  bone]
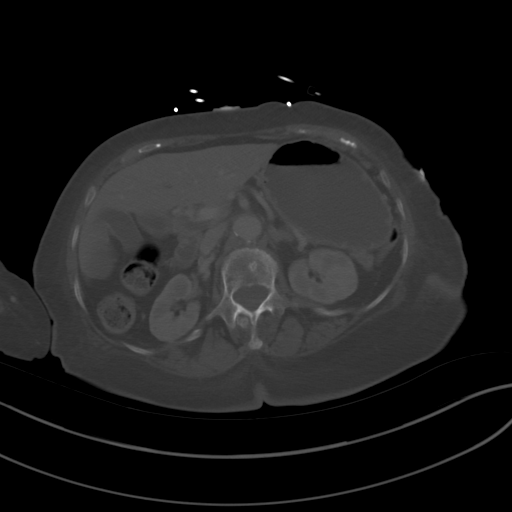
[im 66/84  soft-tissue]
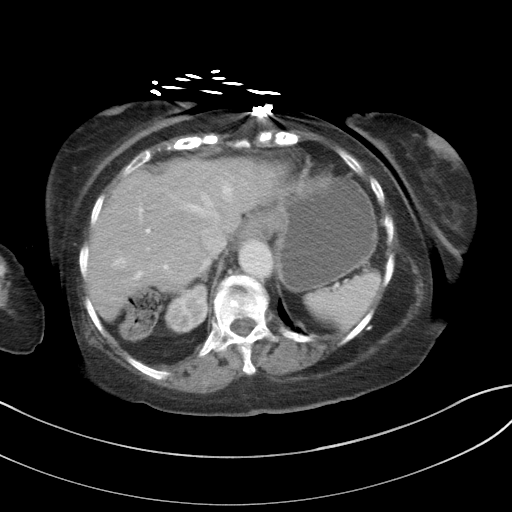
[im 70/84  soft-tissue]
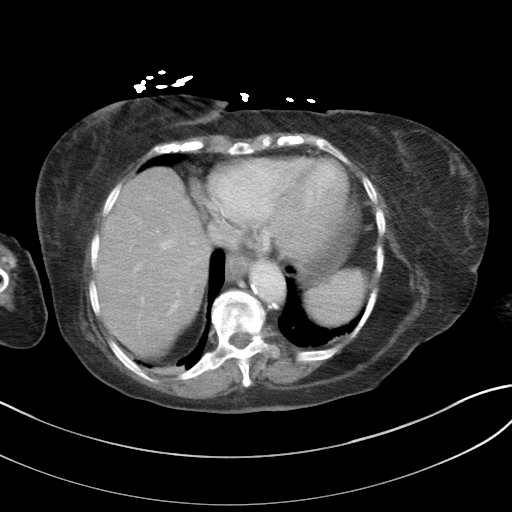
[im 79/84  soft-tissue]
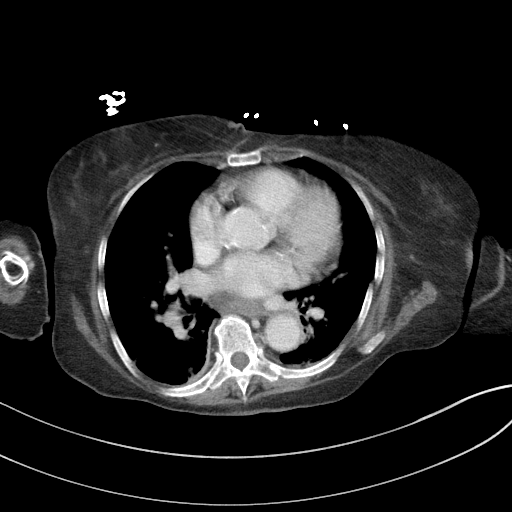

[Series 6: a/p w/ cor · coronal · 0.59mm/px · 3 of 105 slices shown]
[im 35/105  soft-tissue]
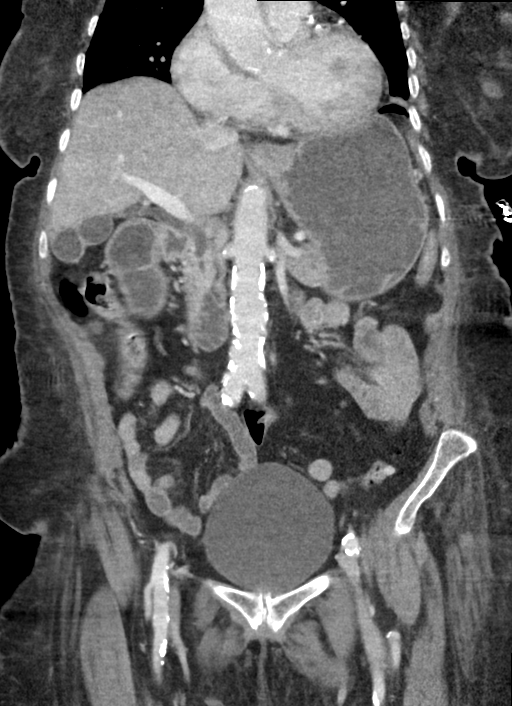
[im 47/105  soft-tissue]
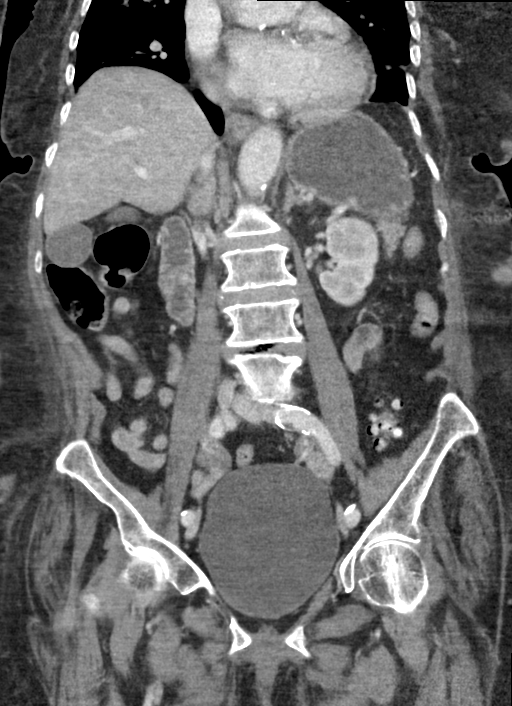
[im 58/105  soft-tissue]
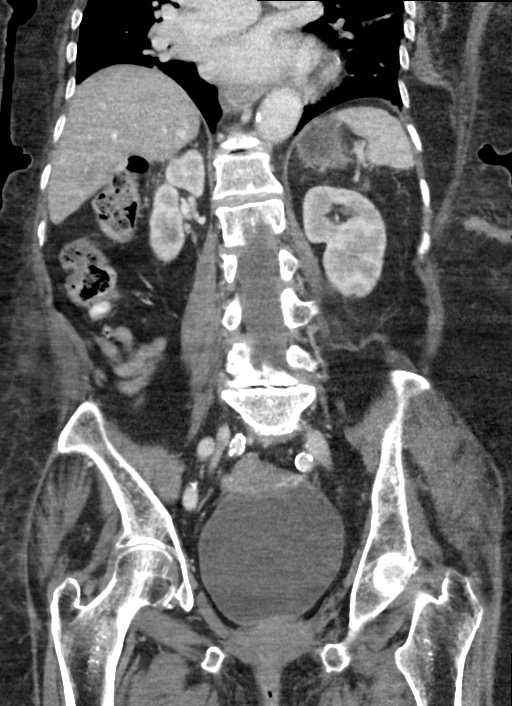

[15 of 46 positions shown; findings below may reference images not displayed]

FINDINGS: Lower chest: Bibasilar atelectasis is noted. Mild wall thickening is
suggested along the distal esophagus. Diffuse coronary artery
calcifications are seen.

Hepatobiliary: Small nonspecific hypodensities are seen within the
liver, measuring up to 7 mm in size. The liver is otherwise
unremarkable in appearance. The gallbladder is grossly unremarkable
in appearance. The common bile duct remains normal in caliber.

Pancreas: The pancreas is within normal limits.

Spleen: The spleen is unremarkable in appearance.

Adrenals/Urinary Tract: The adrenal glands are unremarkable in
appearance. The kidneys are within normal limits. There is no
evidence of hydronephrosis. No renal or ureteral stones are
identified. No perinephric stranding is seen.

Stomach/Bowel: The stomach is unremarkable in appearance. The small
bowel is within normal limits. The appendix is normal in caliber,
without evidence of appendicitis. The colon is unremarkable in
appearance.

Scattered diverticulosis is noted along the cecum and descending
colon.

Vascular/Lymphatic: Diffuse calcification is seen along the
abdominal aorta and its branches. The abdominal aorta is otherwise
grossly unremarkable. The inferior vena cava is grossly
unremarkable. No retroperitoneal lymphadenopathy is seen. No pelvic
sidewall lymphadenopathy is identified.

Reproductive: The bladder is moderately distended and within normal
limits. The uterus is grossly unremarkable in appearance. The
ovaries are relatively symmetric. No suspicious adnexal masses are
seen.

Other: No additional soft tissue abnormalities are seen.

Musculoskeletal: No acute osseous abnormalities are identified.
Multilevel vacuum phenomenon is noted along the lower lumbar spine.
There is grade 1 anterolisthesis of L4 on L5. The visualized
musculature is unremarkable in appearance.
IMPRESSION: 1. No acute abnormality seen to explain the patient's symptoms.
2. Mild wall thickening suggested along the distal esophagus. Would
correlate for any evidence of esophagitis.
3. Scattered diverticulosis along the cecum and descending colon,
without evidence of diverticulitis.
4. Diffuse aortic atherosclerosis.
5. Diffuse coronary artery calcifications.
6. Bibasilar atelectasis noted.
7. Small nonspecific hypodensities within the liver, measuring up to
7 mm in size.
8. Mild degenerative change along the lower lumbar spine.
